# Patient Record
Sex: Female | Born: 1985 | Race: White | Hispanic: No | Smoking: Former smoker
Health system: Southern US, Community
[De-identification: ages and names within clinical notes are randomized; demographics above are authoritative.]

## PROBLEM LIST (undated history)

## (undated) DIAGNOSIS — T7840XA Allergy, unspecified, initial encounter: Secondary | ICD-10-CM

## (undated) DIAGNOSIS — R87629 Unspecified abnormal cytological findings in specimens from vagina: Secondary | ICD-10-CM

## (undated) HISTORY — DX: Allergy, unspecified, initial encounter: T78.40XA

## (undated) HISTORY — DX: Unspecified abnormal cytological findings in specimens from vagina: R87.629

## (undated) HISTORY — PX: WISDOM TOOTH EXTRACTION: SHX21

---

## 2007-02-15 LAB — CONVERTED CEMR LAB: Pap Smear: NORMAL

## 2007-02-16 ENCOUNTER — Ambulatory Visit: Payer: Self-pay | Admitting: Family Medicine

## 2007-02-16 DIAGNOSIS — J309 Allergic rhinitis, unspecified: Secondary | ICD-10-CM

## 2007-02-16 DIAGNOSIS — E669 Obesity, unspecified: Secondary | ICD-10-CM

## 2007-02-16 DIAGNOSIS — L708 Other acne: Secondary | ICD-10-CM | POA: Insufficient documentation

## 2007-02-16 DIAGNOSIS — N915 Oligomenorrhea, unspecified: Secondary | ICD-10-CM

## 2007-02-24 LAB — CONVERTED CEMR LAB
ALT: 15 units/L (ref 0–40)
Albumin: 3.9 g/dL (ref 3.5–5.2)
Alkaline Phosphatase: 71 units/L (ref 39–117)
BUN: 7 mg/dL (ref 6–23)
CO2: 29 meq/L (ref 19–32)
Calcium: 9.5 mg/dL (ref 8.4–10.5)
GFR calc Af Amer: 139 mL/min
GFR calc non Af Amer: 115 mL/min
LH: 14.2 milliintl units/mL
TSH: 0.91 microintl units/mL (ref 0.35–5.50)
Total CHOL/HDL Ratio: 3.9
Total Protein: 7.3 g/dL (ref 6.0–8.3)
VLDL: 18 mg/dL (ref 0–40)

## 2007-03-04 ENCOUNTER — Telehealth (INDEPENDENT_AMBULATORY_CARE_PROVIDER_SITE_OTHER): Payer: Self-pay | Admitting: *Deleted

## 2007-04-23 ENCOUNTER — Telehealth: Payer: Self-pay | Admitting: Family Medicine

## 2007-08-12 ENCOUNTER — Ambulatory Visit: Payer: Self-pay | Admitting: Family Medicine

## 2007-08-12 LAB — CONVERTED CEMR LAB: Rapid Strep: NEGATIVE

## 2007-08-14 ENCOUNTER — Telehealth (INDEPENDENT_AMBULATORY_CARE_PROVIDER_SITE_OTHER): Payer: Self-pay | Admitting: *Deleted

## 2007-08-14 ENCOUNTER — Encounter: Payer: Self-pay | Admitting: Family Medicine

## 2008-02-12 ENCOUNTER — Ambulatory Visit: Payer: Self-pay | Admitting: Family Medicine

## 2008-02-12 LAB — CONVERTED CEMR LAB: Rapid Strep: NEGATIVE

## 2008-07-12 ENCOUNTER — Ambulatory Visit: Payer: Self-pay | Admitting: Family Medicine

## 2008-07-20 ENCOUNTER — Other Ambulatory Visit: Admission: RE | Admit: 2008-07-20 | Discharge: 2008-07-20 | Payer: Self-pay | Admitting: Family Medicine

## 2008-07-20 ENCOUNTER — Ambulatory Visit: Payer: Self-pay | Admitting: Family Medicine

## 2008-07-20 ENCOUNTER — Encounter: Payer: Self-pay | Admitting: Family Medicine

## 2008-07-27 DIAGNOSIS — IMO0002 Reserved for concepts with insufficient information to code with codable children: Secondary | ICD-10-CM

## 2009-01-20 ENCOUNTER — Ambulatory Visit: Payer: Self-pay | Admitting: Family Medicine

## 2009-12-01 ENCOUNTER — Encounter: Payer: Self-pay | Admitting: Family Medicine

## 2009-12-01 ENCOUNTER — Ambulatory Visit: Payer: Self-pay | Admitting: Family Medicine

## 2010-02-20 ENCOUNTER — Telehealth (INDEPENDENT_AMBULATORY_CARE_PROVIDER_SITE_OTHER): Payer: Self-pay | Admitting: *Deleted

## 2010-02-20 ENCOUNTER — Encounter (INDEPENDENT_AMBULATORY_CARE_PROVIDER_SITE_OTHER): Payer: Self-pay | Admitting: *Deleted

## 2010-07-25 ENCOUNTER — Other Ambulatory Visit: Admission: RE | Admit: 2010-07-25 | Discharge: 2010-07-25 | Payer: Self-pay | Admitting: Family Medicine

## 2010-07-25 ENCOUNTER — Ambulatory Visit: Payer: Self-pay | Admitting: Family Medicine

## 2010-07-25 DIAGNOSIS — R51 Headache: Secondary | ICD-10-CM | POA: Insufficient documentation

## 2010-07-25 DIAGNOSIS — R519 Headache, unspecified: Secondary | ICD-10-CM | POA: Insufficient documentation

## 2010-07-26 ENCOUNTER — Encounter (INDEPENDENT_AMBULATORY_CARE_PROVIDER_SITE_OTHER): Payer: Self-pay | Admitting: *Deleted

## 2010-07-30 LAB — CONVERTED CEMR LAB: Pap Smear: NEGATIVE

## 2010-08-06 ENCOUNTER — Telehealth: Payer: Self-pay | Admitting: Family Medicine

## 2010-08-21 ENCOUNTER — Ambulatory Visit: Payer: Self-pay | Admitting: Family Medicine

## 2010-08-21 ENCOUNTER — Encounter: Payer: Self-pay | Admitting: Family Medicine

## 2010-08-21 LAB — CONVERTED CEMR LAB: Rapid Strep: NEGATIVE

## 2010-09-14 ENCOUNTER — Ambulatory Visit: Payer: Self-pay | Admitting: Family Medicine

## 2010-10-17 NOTE — Progress Notes (Signed)
----   Converted from flag ---- ---- 02/20/2010 8:29 AM, Kerby Nora MD wrote: CMET, lipids Dx v77.91  ---- 02/19/2010 11:54 AM, Mills Koller wrote: cpx labs for tomorrow, please ------------------------------

## 2010-10-17 NOTE — Progress Notes (Signed)
Summary: wants different birth control  Phone Note Call from Patient Call back at Home Phone [phone]   Caller: Patient Call For: Shelby Nora MD Summary of Call: Patient is asking if she can be switched to a different birth control. She says that the otho evra  patch that she is using makes her feel crazy and really emotional. [facility] [address]  Initial call taken by: Melody Comas,  August 06, 2010 1:54 PM  Follow-up for Phone Call        This is very difficult for me to answer without seeing the patient or knowing what she has tried in past.  I would recommend either making her an appointment to be seen or forwarding to Dr. Ermalene Searing (maybe she is checking emr every couple of days?) Ruthe Mannan MD  August 06, 2010 1:57 PM   Will forward to Dr. Ermalene Searing. Melody Comas  August 06, 2010 1:58 PM    Additional Follow-up for Phone Call Additional follow up Details #1::        We can have her try Depo Provera injections every 3 months or lower dose estrogen oral contraceptive pills taken daily.  If she wishes to discuss options in more detail...have her make appt to discuss.  Additional Follow-up by: Shelby Nora MD,  August 08, 2010 12:23 AM    Additional Follow-up for Phone Call Additional follow up Details #2::    Patient would like to try the lower dose estrogen pill once daily  [facility] [address] Follow-up by: Benny Lennert CMA Duncan Dull),  August 08, 2010 8:38 AM  New/Updated Medications: YAZ 3-0.02 MG TABS (DROSPIRENONE-ETHINYL ESTRADIOL) 1 tab po daily Prescriptions: YAZ 3-0.02 MG TABS (DROSPIRENONE-ETHINYL ESTRADIOL) 1 tab po daily  #1 pack x 11   Entered and Authorized by:   Shelby Nora MD   Signed by:   Shelby Nora MD on 08/10/2010   Method used:   [facility]        [id number]       [address]       [place], [place]  [address]       Ph: [phone]       Fax: [phone]   RxID:   [id number]

## 2010-10-17 NOTE — Letter (Signed)
Summary: Out of Work  [facility]  [address] [place], [place] [address]   Phone: [phone]  Fax: [phone]    August 21, 2010   Employee:  Shelby Cook    To Whom It May Concern:   For Medical reasons, please excuse the above named employee from work for the following dates:  Start:  August 21, 2010 11:57 AM   End:  August 21, 2010 11:57 AM   If you need additional information, please feel free to contact our office.         Sincerely,    Kerby Nora MD

## 2010-10-17 NOTE — Letter (Signed)
Summary: [place] No Show Letter  [place] at [facility]  [address] [place], [place] [address]   Phone: [phone]  Fax: [phone]    07/26/2010 MRN: [id number]  [facility] [address] [place], [place]  [address]   Dear Ms. SUMMERS,   Our records indicate that you missed your scheduled appointment with ____lab_________________ on ___11.10.11_________.  Please contact this office to reschedule your appointment as soon as possible.  It is important that you keep your scheduled appointments with your physician, so we can provide you the best care possible.  Please be advised that there may be a charge for "no show" appointments.    Sincerely,   [place] at [facility]

## 2010-10-17 NOTE — Assessment & Plan Note (Signed)
Summary: CONGESTION,FEVER/CLE   Vital Signs:  Patient profile:   25 year old female Height:      66.5 inches Weight:      232.38 pounds BMI:     37.08 Temp:     99.9 degrees F oral Pulse rate:   80 / minute Pulse rhythm:   regular BP sitting:   114 / 74  (left arm) Cuff size:   large  Vitals Entered By: Delilah Shan CMA Duncan Dull) (December 01, 2009 2:43 PM) CC: Congestion, fever   History of Present Illness: 25 yo healthy feamle with 3 day h/o of fevers, congestion cough. Fever T max 101. Has body aches. Cough is not productive but she feels like she has mucous that she can't get up. Hurts to cough, right lower chest. No SOB, no audible wheezing. Never had anything like this before. Had flu shot this year.  Current Medications (verified): 1)  Claritin 10 Mg Tabs (Loratadine) .... Take 1 Tablet By Mouth Once A Day 2)  Azithromycin 250 Mg  Tabs (Azithromycin) .... 2 By  Mouth Today and Then 1 Daily For 4 Days 3)  Cheratussin Ac 100-10 Mg/63ml Syrp (Guaifenesin-Codeine) .... 5 Ml At Bedtime As Needed Cough  Allergies (verified): No Known Drug Allergies  Review of Systems      See HPI General:  Complains of chills and fever; denies fatigue. Resp:  Complains of coughing up blood; denies shortness of breath, sputum productive, and wheezing.  Physical Exam  General:  alert, well-developed, well-nourished, and well-hydrated.   No Increased WOB Nose:  boggy and edematus, sinuses tender throughout no airflow obstruction.   Mouth:  no exudates and pharyngeal erythema.   Lungs:  moist cough, poor respiratory effort when taking deep breaths-pleuritic pain. No crackles but not good exam due to poor effort. Heart:  Normal rate and regular rhythm. S1 and S2 normal without gallop, murmur, click, rub or other extra sounds. Extremities:  no edema Psych:  normally interactive and good eye contact.     Impression & Recommendations:  Problem # 1:  COUGH (ICD-786.2) Assessment  New given fever and pleuritic pain, will cover with Zpack. Send for 2 view CXR to rule out pneumonia. Cheratussin for cough.   Orders: Radiology Referral (Radiology)  Complete Medication List: 1)  Claritin 10 Mg Tabs (Loratadine) .... Take 1 tablet by mouth once a day 2)  Azithromycin 250 Mg Tabs (Azithromycin) .... 2 by  mouth today and then 1 daily for 4 days 3)  Cheratussin Ac 100-10 Mg/51ml Syrp (Guaifenesin-codeine) .... 5 ml at bedtime as needed cough  Patient Instructions: 1)  Take antibiotic as directed.  Drink lots of fluids.  Treat sympotmatically with Mucinex, nasal saline irrigation, and Tylenol/Ibuprofen..  Cough suppressant at night. Call if not improving as expected in 5-7 days.  2)  Stop by to see Shirlee Limerick on the way out to set up your chest xray. Prescriptions: CHERATUSSIN AC 100-10 MG/5ML SYRP (GUAIFENESIN-CODEINE) 5 ml at bedtime as needed cough  #4 ounces x 0   Entered and Authorized by:   Ruthe Mannan MD   Signed by:   Ruthe Mannan MD on 12/01/2009   Method used:   Print then Give to Patient   RxID:   [id number] AZITHROMYCIN 250 MG  TABS (AZITHROMYCIN) 2 by  mouth today and then 1 daily for 4 days  #6 x 0   Entered and Authorized by:   Ruthe Mannan MD   Signed by:   Ruthe Mannan MD on  12/01/2009   Method used:   [facility]        [id number]       [address]       [place], [place]  [address]       Ph: [phone]       Fax: [phone]   RxID:   [id number]   Current Allergies (reviewed today): No known allergies

## 2010-10-17 NOTE — Letter (Signed)
Summary: [place] No Show Letter  [place] at [facility]  [address] [place], [place] [address]   Phone: [phone]  Fax: [phone]    02/20/2010 MRN: [id number]  [facility] [address] [place], [place]  [address]   Dear Ms. SUMMERS,   Our records indicate that you missed your scheduled appointment with ___Lab__________________ on __6.7.11__________.  Please contact this office to reschedule your appointment as soon as possible.  It is important that you keep your scheduled appointments with your physician, so we can provide you the best care possible.  Please be advised that there may be a charge for "no show" appointments.    Sincerely,   [place] at [facility]

## 2010-10-17 NOTE — Assessment & Plan Note (Signed)
Summary: CPX/CLE   Vital Signs:  Patient profile:   25 year old female Height:      66.5 inches Weight:      234.0 pounds BMI:     37.34 Temp:     99.2 degrees F oral Pulse rate:   80 / minute Pulse rhythm:   regular BP sitting:   110 / 60  (left arm) Cuff size:   large  Vitals Entered By: Benny Lennert CMA Duncan Dull) (July 25, 2010 2:27 PM)  History of Present Illness: Chief complaint cpx  The patient is here for annual wellness exam and preventative care.       In past.. 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 Sent to GYN for colposcopy.Pt already notified per report.  low grade dysplasia.  Recommended q 3 month pap smears..she was unable to do so given dropped of step dad's insurance.    In last 3 months....headaches every other day. Pain is in posterior head..feels like pressure. No throbbing. No nausea,  Mild sensitivity to light.  No vomiting. No numbness, no weakness. No neck pain. No fever.   Tylenol and excedrine do not helps. Some increase in stress in last few months.  No family hisotry of migraines.  Has gained weight in last 2 year... 50 lbs. Exercsiing 2 time a week. Healthy eating...fruits and veggies.   Problems Prior to Update: 1)  Headache  (ICD-784.0) 2)  Abnormal Pap Smear, Lgsil  (ICD-795.09) 3)  Oligomenorrhea  (ICD-626.1) 4)  Acne Nec  (ICD-706.1) 5)  Obesity Nos  (ICD-278.00) 6)  Healthy Young Woman  (ICD-V70.0) 7)  Allergic Rhinitis  (ICD-477.9)  Current Medications (verified): 1)  Allegra Allergy 180 Mg Tabs (Fexofenadine Hcl) .... Take One Tablet Daily 2)  Multivitamins  Caps (Multiple Vitamin) .... One Tablet Daily  Allergies (verified): No Known Drug Allergies  Past History:  Past medical, surgical, family and social histories (including risk factors) reviewed, and no changes noted (except as noted below).  Past Medical History: Reviewed history from 02/16/2007 and no changes required. Allergic rhinitis  Past Surgical History: Reviewed  history from 02/16/2007 and no changes required. Denies surgical history  Family History: Reviewed history from 02/16/2007 and no changes required. father age 3 bipolar do, drug abuse mother age 95 osteoporosis 2 sisters: allergies, scoliosis PGF: MI less tan age 75, DM PGM: breast cancer and lung cancer  Social History: Reviewed history from 02/16/2007 and no changes required. Never Smoked Alcohol use-yes rarely Drug use-no Occupation: [employer], was at [employer] now looking at [employer],  6 month relationship, no sex  Regular exercise-yes, joining [employer] Diet: Avoiding sugar, fruits and veggies  Review of Systems       Light fine hair on sides of face, mustache... General:  Denies fatigue and fever. CV:  Denies chest pain or discomfort. Resp:  Denies shortness of breath. GI:  Denies abdominal pain, bloody stools, constipation, and diarrhea. GU:  Complains of abnormal vaginal bleeding; denies dysuria; Oligomennorhea.Marland Kitchenoccuring only once every three months...in last 2 years. current sexual activity.  Physical Exam  General:  obese appearing female iNNAD  Head:  no maxillary sinus ttp Eyes:  No corneal or conjunctival inflammation noted. EOMI. Perrla. Funduscopic exam benign, without hemorrhages, exudates or papilledema. Vision grossly normal. Ears:  External ear exam shows no significant lesions or deformities.  Otoscopic examination reveals clear canals, tympanic membranes are intact bilaterally without bulging, retraction, inflammation or discharge. Hearing is grossly normal bilaterally. Nose:  External nasal examination shows no  deformity or inflammation. Nasal mucosa are pink and moist without lesions or exudates. Mouth:  Oral mucosa and oropharynx without lesions or exudates.  Teeth in good repair. Neck:  no carotid bruit or thyromegaly no cervical or supraclavicular lymphadenopathy  Chest Wall:  No deformities, masses, or tenderness noted. Breasts:  No mass,  nodules, thickening, tenderness, bulging, retraction, inflamation, nipple discharge or skin changes noted.   Lungs:  Normal respiratory effort, chest expands symmetrically. Lungs are clear to auscultation, no crackles or wheezes. Heart:  Normal rate and regular rhythm. S1 and S2 normal without gallop, murmur, click, rub or other extra sounds. Abdomen:  Bowel sounds positive,abdomen soft and non-tender without masses, organomegaly or hernias noted. Genitalia:  Pelvic Exam:        External: normal female genitalia without lesions or masses        Vagina: normal without lesions or masses        Cervix: normal without lesions or masses        Adnexa: normal bimanual exam without masses or fullness        Uterus: normal by palpation        Pap smear: performed Pulses:  R and L posterior tibial pulses are full and equal bilaterally  Extremities:  no edmea Skin:  Intact without suspicious lesions or rashes Psych:  Cognition and judgment appear intact. Alert and cooperative with normal attention span and concentration. No apparent delusions, illusions, hallucinations   Impression & Recommendations:  Problem # 1:  HEALTHY YOUNG WOMAN (ICD-V70.0) The patient's preventative maintenance and recommended screening tests for an annual wellness exam were reviewed in full today. Brought up to date unless services declined.  Counselled on the importance of diet, exercise, and its role in overall health and mortality. The patient's FH and SH was reviewed, including their home life, tobacco status, and drug and alcohol status.     Problem # 2:  ABNORMAL PAP SMEAR, LGSIL (ICD-795.09) PAp pending today.   Problem # 3:  HEADACHE (ICD-784.0) Keep headache diary. Reviewed migraine triggers.  Problem # 4:  OLIGOMENORRHEA (ICD-626.1) Most consistent with PCOS.Marland Kitchenwill reeval  LH and FSH. Start back on hormonal birth control.  Complete Medication List: 1)  Allegra Allergy 180 Mg Tabs (Fexofenadine hcl) ....  Take one tablet daily 2)  Multivitamins Caps (Multiple vitamin) .... One tablet daily 3)  Ortho Evra 150-20 Mcg/24hr Ptwk (Norelgestromin-eth estradiol) .... Apply as directed  Other Orders: Admin 1st Vaccine (04540) Flu Vaccine 71yrs + (98119) Tdap => 33yrs IM (14782) Admin of Any Addtl Vaccine (95621)  Patient Instructions: 1)  Fasting lipid,CMET,FSH, LH, TSH  Dx v77.91, 626.1 2)   Likely PCOS...will call with lab results 3)   Go ahead and start ortho Evra patch. 4)   Start headache diary. 5)   Review headahce info packet. 6)   Follow up in 1 month for headache. Prescriptions: ORTHO EVRA 150-20 MCG/24HR PTWK (NORELGESTROMIN-ETH ESTRADIOL) Apply as directed  #4 x 11   Entered and Authorized by:   Kerby Nora MD   Signed by:   Kerby Nora MD on 07/25/2010   Method used:   [facility]        [facility] [phone][facility] (retail)       [address] [place], [place]  [address]       Ph: [phone] or [phone]       Fax: [phone]  RxID:   [id number]    Orders Added: 1)  Est. Patient 18-39 years [99395] 2)  Admin 1st Vaccine [90471] 3)  Flu Vaccine 25yrs + [14782] 4)  Tdap => 53yrs IM [90715] 5)  Admin of Any Addtl Vaccine [95621]   Immunizations Administered:  Tetanus Vaccine:    Vaccine Type: Tdap    Site: right deltoid    Mfr: GlaxoSmithKline    Dose: 0.5 ml    Route: IM    Given by: Benny Lennert CMA (AAMA)    Exp. Date: 07/05/2012    Lot #: [id number]    VIS given: 08/03/08 version given July 25, 2010.   Immunizations Administered:  Tetanus Vaccine:    Vaccine Type: Tdap    Site: right deltoid    Mfr: GlaxoSmithKline    Dose: 0.5 ml    Route: IM    Given by: Benny Lennert CMA (AAMA)    Exp. Date: 07/05/2012    Lot #: [id number]    VIS given: 08/03/08 version given July 25, 2010.  Current Allergies (reviewed today): No known allergies   Last Flu Vaccine:  given (06/16/2008 3:14:54 PM) Flu Vaccine  Result Date:  07/25/2010 Flu Vaccine Result:  given Flu Vaccine Next Due:  1 yr Last TD:  given (09/17/1999 3:15:54 PM) TD Result Date:  07/25/2010 TD Result:  given TD Next Due:  10 yr                   Flu Vaccine Consent Questions     Do you have a history of severe allergic reactions to this vaccine? no    Any prior history of allergic reactions to egg and/or gelatin? no    Do you have a sensitivity to the preservative Thimersol? no    Do you have a past history of Guillan-Barre Syndrome? no    Do you currently have an acute febrile illness? no    Have you ever had a severe reaction to latex? no    Vaccine information given and explained to patient? yes    Are you currently pregnant? no    Lot Number:[id number]   Exp Date:03/16/2011   Site Given  Left Deltoid IM

## 2010-10-17 NOTE — Assessment & Plan Note (Signed)
Summary: 11:30 ST/CLE   Vital Signs:  Patient profile:   25 year old female Height:      66.5 inches Weight:      235.8 pounds BMI:     37.62 Temp:     98.4 degrees F oral Pulse rate:   80 / minute Pulse rhythm:   regular BP sitting:   110 / 70  (left arm) Cuff size:   large  Vitals Entered By: Benny Lennert CMA Duncan Dull) (August 21, 2010 11:31 AM)  History of Present Illness: Chief complaint Sore swollen throat    Acute Visit History:      The patient complains of nasal discharge and sore throat.  These symptoms began 2 days ago.  She denies chest pain, cough, earache, fever, and sinus problems.  Other comments include: thoat swollen in front.  Improved some now.   USing dayquil for sympotms.        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  Physical Exam  General:  Well-developed,well-nourished,in no acute distress; alert,appropriate and cooperative throughout examination Ears:  External ear exam shows no significant lesions or deformities.  Otoscopic examination reveals clear canals, tympanic membranes are intact bilaterally without bulging, retraction, inflammation or discharge. Hearing is grossly normal bilaterally. Nose:  External nasal examination shows no deformity or inflammation. Nasal mucosa are pink and moist without lesions or exudates. Mouth:  good dentition, no exudates, no posterior lymphoid hypertrophy, and pharyngeal erythema.   Neck:  mild anterior cervical ttp, no nodes palpated Lungs:  Normal respiratory effort,  chest expands symmetrically. Lungs are clear to auscultation, no crackles or wheezes. Heart:  Normal rate and regular rhythm. S1 and S2 normal without gallop, murmur, click, rub or other extra sounds.   Impression & Recommendations:  Problem # 1:  URI (ICD-465.9)  Her updated medication list for this problem includes:    Allegra Allergy 180 Mg Tabs (Fexofenadine hcl) .Marland Kitchen... Take one tablet daily  Instructed on symptomatic treatment. Call if symptoms persist or worsen.   Complete Medication List: 1)  Allegra Allergy 180 Mg Tabs (Fexofenadine hcl) .... Take one tablet daily 2)  Multivitamins Caps (Multiple vitamin) .... One tablet daily 3)  Yaz 3-0.02 Mg Tabs (Drospirenone-ethinyl estradiol) .Marland Kitchen.. 1 tab po daily  Patient Instructions: 1)  Dayquil for symptoms. 2)  Ibuprofen for ST pain. 3)  Call if not gradaully improving  after 5 days.    Orders Added: 1)  Est. Patient Level II [40981]    Current Allergies (reviewed today): No known allergies   Laboratory Results    Other Tests  Rapid Strep: negative  Kit Test Internal QC: Negative   (Normal Range: Negative)

## 2010-11-16 ENCOUNTER — Encounter: Payer: Self-pay | Admitting: Family Medicine

## 2010-11-16 ENCOUNTER — Encounter (INDEPENDENT_AMBULATORY_CARE_PROVIDER_SITE_OTHER): Payer: Self-pay | Admitting: *Deleted

## 2010-11-16 ENCOUNTER — Ambulatory Visit (INDEPENDENT_AMBULATORY_CARE_PROVIDER_SITE_OTHER): Payer: 59 | Admitting: Family Medicine

## 2010-11-16 DIAGNOSIS — B309 Viral conjunctivitis, unspecified: Secondary | ICD-10-CM

## 2010-11-16 DIAGNOSIS — J209 Acute bronchitis, unspecified: Secondary | ICD-10-CM

## 2010-11-22 NOTE — Assessment & Plan Note (Signed)
Summary: ?SINUS INFECTION,?PINK EYE/CLE   [facility]   Vital Signs:  Patient profile:   25 year old female Weight:      233 pounds Temp:     99.3 degrees F oral Pulse rate:   68 / minute Pulse rhythm:   regular BP sitting:   108 / 70  (left arm) Cuff size:   large  Vitals Entered By: Selena Batten Dance CMA Duncan Dull) (November 16, 2010 2:36 PM) CC: ? Sinus infection/? pink eye   History of Present Illness: CC: sinus? pink eye  1 mo h/o nasal congestion, cough productive of green sputum, sinus headache helped by tylenol.  No pressure in face.  Tried advil cold/sinus and allergy medicine (allegra).  + nasal discharge, clear.  Cough worse at night.  Today awoke with L eye matted shut, red.  yellow.  no vision changes.  Washed with soap.  Itchy.  No pain.  No ST, abd pain, fevers/chills, n/v/d, rashes, myalgias, arthralgias.  No sick contacts, no smokers at home, no h/o asthma.  + allergies.  Current Medications (verified): 1)  Allegra Allergy 180 Mg Tabs (Fexofenadine Hcl) .... Take One Tablet Daily 2)  Multivitamins  Caps (Multiple Vitamin) .... One Tablet Daily 3)  Yaz 3-0.02 Mg Tabs (Drospirenone-Ethinyl Estradiol) .Marland Kitchen.. 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  PERRLA, EOMI wihtout pain.  No matting appreciated today Ears:  TMs  clear bilaterally Nose:  nares congested bilaterally Mouth:  MMM, + pharyngeal erythema no exudates Neck:  no LAD Lungs:  Normal respiratory effort, chest expands symmetrically. Lungs are clear to auscultation, no crackles or wheezes. Heart:  Normal rate and regular rhythm. S1 and S2 normal without gallop, murmur, click, rub or other extra sounds. Pulses:  2+ rad pulses, brisk cap refill   Impression & Recommendations:  Problem # 1:  ACUTE BRONCHITIS (ICD-466.0) going on 1 mo.  treat with zpack.  red flags to return discussed.  Her updated medication list for this problem includes:    Zithromax Z-pak 250 Mg Tabs (Azithromycin) ..... Use as directed  Problem # 2:  CONJUNCTIVITIS, VIRAL, LEFT (ICD-077.99) supportive care as per instructions.  wash hands as contagious.  Complete Medication List: 1)  Allegra Allergy 180 Mg Tabs (Fexofenadine hcl) .... Take one tablet daily 2)  Multivitamins Caps (Multiple vitamin) .... One tablet daily 3)  Yaz 3-0.02 Mg Tabs (Drospirenone-ethinyl estradiol) .Marland Kitchen.. 1 tab po daily 4)  Zithromax Z-pak 250 Mg Tabs (Azithromycin) .... Use as directed  Patient Instructions: 1)  Sounds like bronchitis, treat with zpack antibiotic as going on for 1 month. 2)  For eye i think it's pink eye (viral conjunctivitis).  Treat with warm compresses, and lubricating eye drops (OTC). 3)  delsym cough syrup. 4)  Push fluids and plenty of rest. 5)  Nasal saline irrigation. 6)  Call us if fevers >101.5, cough worsening, or other concerns. Prescriptions: ZITHROMAX Z-PAK 250  MG TABS (AZITHROMYCIN) use as directed  #1 x 0   Entered and Authorized by:   Eustaquio Boyden  MD   Signed by:   Eustaquio Boyden  MD on 11/16/2010   Method used:   [facility]        [facility] [phone][facility] (retail)       [address] [place], [place]  [address]       Ph: [phone] or [phone]       Fax: [phone]   RxID:   [id number]    Orders  Added: 1)  Est. Patient Level III [36644]    Current Allergies (reviewed today): No known allergies

## 2010-11-22 NOTE — Letter (Signed)
Summary: Out of Work  [facility]  [address] [place], [place] [address]   Phone: [phone]  Fax: [phone]    November 16, 2010   Employee:  TARINI CARRIER    To Whom It May Concern:   For Medical reasons, please excuse the above named employee from work for the following dates:  Start:  November 16, 2010   End:  November 16, 2010   If you need additional information, please feel free to contact our office.         Sincerely,       Selena Batten Dance CMA (AAMA)

## 2011-07-17 ENCOUNTER — Encounter: Payer: Self-pay | Admitting: *Deleted

## 2011-07-17 ENCOUNTER — Encounter: Payer: Self-pay | Admitting: Family Medicine

## 2011-07-17 ENCOUNTER — Ambulatory Visit (INDEPENDENT_AMBULATORY_CARE_PROVIDER_SITE_OTHER): Payer: 59 | Admitting: Family Medicine

## 2011-07-17 VITALS — BP 110/80 | HR 93 | Temp 98.0°F | Ht 67.0 in | Wt 225.0 lb

## 2011-07-17 DIAGNOSIS — J069 Acute upper respiratory infection, unspecified: Secondary | ICD-10-CM

## 2011-07-17 DIAGNOSIS — R110 Nausea: Secondary | ICD-10-CM

## 2011-07-17 MED ORDER — PROMETHAZINE HCL 25 MG PO TABS: 25.0000 mg | ORAL_TABLET | Freq: Four times a day (QID) | ORAL | Status: AC | PRN

## 2011-07-17 MED ORDER — HYDROCODONE-HOMATROPINE 5-1.5 MG/5ML PO SYRP: ORAL_SOLUTION | ORAL | Status: AC

## 2011-07-17 NOTE — Progress Notes (Signed)
  Subjective:    Patient ID: Shelby Cook, female    DOB: 08/24/1986, 25 y.o.   MRN: [id number]  HPI  Shelby Cook, a 25 y.o. female presents today in the office for the following:    Got really nauseous. Dry-heaving. Still with a runny nose and chest  Had a fever the first day Niece is sick.  Now is the head congestion and nausea.   Patent presents with runny nose, sneezing, cough, sore throat, malaise and minimal / low-grade fever .   + recent exposure to others with similar symptoms.   The patent denies sore throat as the primary complaint. Denies sthortness of breath/wheezing, high fever, chest pain, rhinits for more than 14 days, significant myalgia, otalgia, facial pain, abdominal pain, changes in bowel or bladder.  PMH, PHS, Allergies, Problem List, Medications, Family History, and Social History have all been reviewed.  ROS: as above, eating and drinking - tolerating PO. + nausea Urinating normally. No excessive vomitting or diarrhea. O/w as above.  PHYSICAL EXAM  Blood pressure 110/80, pulse 93, temperature 98 F (36.7 C), temperature source Oral, height 5\' 7"  (1.702 m), weight 225 lb (102.059 kg), SpO2 99.00%.  PE: GEN: WDWN, Non-toxic, Atraumatic, normocephalic. A and O x 3. HEENT: Oropharynx clear without exudate, MMM, no significant LAD, mild rhinnorhea Ears: TM clear, COL visualized with good landmarks CV: RRR, no m/g/r. Pulm: CTA B, no wheezes, rhonchi, or crackles, normal respiratory effort. EXT: no c/c/e Psych: well oriented, neither depressed nor anxious in appearance  A/P: 1. URI. Supportive care reviewed with patient. See patient instruction section. 2. Nausea: phenergan prn, advance diet as tol   The PMH, PSH, Social History, Family History, Medications, and allergies have been reviewed in [facility], and have been updated if relevant.   Review of Systems     Objective:   Physical Exam        Assessment & Plan:

## 2011-08-15 ENCOUNTER — Other Ambulatory Visit: Payer: Self-pay | Admitting: Family Medicine

## 2012-04-09 ENCOUNTER — Other Ambulatory Visit (HOSPITAL_COMMUNITY)
Admission: RE | Admit: 2012-04-09 | Discharge: 2012-04-09 | Disposition: A | Discharge status: Home / Self Care | Payer: BC Managed Care – PPO | Source: Ambulatory Visit | Attending: Family Medicine | Admitting: Family Medicine

## 2012-04-09 ENCOUNTER — Other Ambulatory Visit: Payer: Self-pay | Admitting: Family Medicine

## 2012-04-09 DIAGNOSIS — Z124 Encounter for screening for malignant neoplasm of cervix: Secondary | ICD-10-CM | POA: Insufficient documentation

## 2013-11-08 ENCOUNTER — Emergency Department (HOSPITAL_BASED_OUTPATIENT_CLINIC_OR_DEPARTMENT_OTHER)
Admission: EM | Admit: 2013-11-08 | Discharge: 2013-11-08 | Disposition: A | Discharge status: Home / Self Care | Payer: BC Managed Care – PPO | Attending: Emergency Medicine | Admitting: Emergency Medicine

## 2013-11-08 ENCOUNTER — Other Ambulatory Visit: Payer: Self-pay

## 2013-11-08 ENCOUNTER — Encounter (HOSPITAL_BASED_OUTPATIENT_CLINIC_OR_DEPARTMENT_OTHER): Payer: Self-pay | Admitting: Emergency Medicine

## 2013-11-08 DIAGNOSIS — F410 Panic disorder [episodic paroxysmal anxiety] without agoraphobia: Secondary | ICD-10-CM | POA: Insufficient documentation

## 2013-11-08 DIAGNOSIS — Z3202 Encounter for pregnancy test, result negative: Secondary | ICD-10-CM | POA: Insufficient documentation

## 2013-11-08 DIAGNOSIS — F17200 Nicotine dependence, unspecified, uncomplicated: Secondary | ICD-10-CM | POA: Insufficient documentation

## 2013-11-08 DIAGNOSIS — Z79899 Other long term (current) drug therapy: Secondary | ICD-10-CM | POA: Insufficient documentation

## 2013-11-08 LAB — CBG MONITORING, ED: GLUCOSE-CAPILLARY: 113 mg/dL — AB (ref 70–99)

## 2013-11-08 LAB — PREGNANCY, URINE: PREG TEST UR: NEGATIVE

## 2013-11-08 NOTE — ED Notes (Signed)
CBG: 113 °

## 2013-11-08 NOTE — ED Provider Notes (Signed)
TIME SEEN: 1:22 PM  CHIEF COMPLAINT: dizziness, heart racing, hyperventilating, shaking  HPI: Pt is a 28 y.o. F with no significant past medical history who presents to the emergency department with an episode of palpitations, feeling lightheaded, hyperventilating and shaking. She states this happened when going to eat with her friend. She denies that she was feeling anxious about anything at that time. She's never had similar symptoms in the past. This lasted for several minutes and then resolved. She reports she feels much better now and is back to baseline. He also reports that earlier today while walking down the stairs she felt her legs gave out from her and she slipped but did not fall completely her head her head. She denies any chest pain or shortness of breath. No recent vomiting or diarrhea. No numbness, tingling or focal weakness.  ROS: See HPI Constitutional: no fever  Eyes: no drainage  ENT: no runny nose   Cardiovascular:  no chest pain  Resp: no SOB  GI: no vomiting GU: no dysuria Integumentary: no rash  Allergy: no hives  Musculoskeletal: no leg swelling  Neurological: no slurred speech ROS otherwise negative  PAST MEDICAL HISTORY/PAST SURGICAL HISTORY:  Past Medical History  Diagnosis Date  . Allergy     MEDICATIONS:  Prior to Admission medications   Medication Sig Start Date End Date Taking? Authorizing Provider  fexofenadine (ALLEGRA) 180 MG tablet Take 180 mg by mouth daily.      Historical Provider, MD  GIANVI 3-0.02 MG tablet TAKE 1 TABLET BY MOUTH EVERY DAY 08/15/11   Amy Michelle NasutiE Bedsole, MD  Multiple Vitamin (MULTIVITAMIN) tablet Take 1 tablet by mouth daily.      Historical Provider, MD    ALLERGIES:  No Known Allergies  SOCIAL HISTORY:  History  Substance Use Topics  . Smoking status: Current Every Day Smoker -- 0.50 packs/day    Types: Cigarettes  . Smokeless tobacco: Not on file  . Alcohol Use: Yes    FAMILY HISTORY: Family History  Problem  Relation Age of Onset  . Osteoporosis Mother   . Drug abuse Father   . Mental illness Father     bipolar  . Scoliosis Sister   . Cancer Paternal Grandmother     breast and lung  . Diabetes Paternal Grandfather   . Heart attack Paternal Grandfather   . Allergies Sister     EXAM: BP 145/76  Pulse 94  Temp(Src) 98.3 F (36.8 C) (Oral)  Resp 16  Ht 5\' 7"  (1.702 m)  Wt 250 lb (113.399 kg)  BMI 39.15 kg/m2  SpO2 100% CONSTITUTIONAL: Alert and oriented and responds appropriately to questions. Well-appearing; well-nourished HEAD: Normocephalic EYES: Conjunctivae clear, PERRLcomment no conjunctival pallor ENT: normal nose; no rhinorrhea; moist mucous membranes; pharynx without lesions noted NECK: Supple, no meningismus, no LAD  CARD: RRR; S1 and S2 appreciated; no murmurs, no clicks, no rubs, no gallops RESP: Normal chest excursion without splinting or tachypnea; breath sounds clear and equal bilaterally; no wheezes, no rhonchi, no rales,  ABD/GI: Normal bowel sounds; non-distended; soft, non-tender, no rebound, no guarding BACK:  The back appears normal and is non-tender to palpation, there is no CVA tenderness; no midline spinal tenderness or step-off or deformity EXT: Normal ROM in all joints; non-tender to palpation; no edema; normal capillary refill; no cyanosis    SKIN: Normal color for age and race; warm NEURO: Moves all extremities equally, cranial nerves II through XII intact, sensation to light touch intact diffusely PSYCH:  The patient's mood and manner are appropriate. Grooming and personal hygiene are appropriate.  MEDICAL DECISION MAKING: patient here with an episode of palpitations, hyperventilation, shaking and dizziness. Suspect likely panic attack. She is back to her baseline and is hemodynamically stable. Her EKG shows no ischemic changes, LVH, predominant, delta wave. She is in normal sinus rhythm. Check CBG and urine pregnancy test. Given she is otherwise healthy,  well-appearing young female with no other risk factors for ACS or pulmonary embolus, I do not feel she needs further workup at this time.  ED PROGRESS: Pt's blood glucose was 113. Her urine pregnancy test is negative. We'll discharge him with return precautions. Patient verbalizes understanding and is comfortable plan.     Date: 11/08/2013 12:38  Rate: 98  Rhythm: normal sinus rhythm  QRS Axis: normal  Intervals: normal  ST/T Wave abnormalities: normal  Conduction Disutrbances: none  Narrative Interpretation: unremarkable; no ischemic changes, no LVH, no Brugada, no delta wave, no arrhythmia       Layla Maw Ward, DO 11/08/13 1413

## 2013-11-08 NOTE — ED Notes (Signed)
Pt reports that her legs gave out at work-denies hitting head, denies falling.  Reports going to eat, feeling dizzy and then feeling like her heart was racing-pt noted to be shaking, hyperventilating.

## 2013-11-08 NOTE — ED Notes (Signed)
Pt took Plan B on Saturday.

## 2013-11-08 NOTE — Discharge Instructions (Signed)
Panic Attacks °Panic attacks are sudden, short-lived surges of severe anxiety, fear, or discomfort. They may occur for no reason when you are relaxed, when you are anxious, or when you are sleeping. Panic attacks may occur for a number of reasons:  °· Healthy people occasionally have panic attacks in extreme, life-threatening situations, such as war or natural disasters. Normal anxiety is a protective mechanism of the body that helps us react to danger (fight or flight response). °· Panic attacks are often seen with anxiety disorders, such as panic disorder, social anxiety disorder, generalized anxiety disorder, and phobias. Anxiety disorders cause excessive or uncontrollable anxiety. They may interfere with your relationships or other life activities. °· Panic attacks are sometimes seen with other mental illnesses such as depression and posttraumatic stress disorder. °· Certain medical conditions, prescription medicines, and drugs of abuse can cause panic attacks. °SYMPTOMS  °Panic attacks start suddenly, peak within 20 minutes, and are accompanied by four or more of the following symptoms: °· Pounding heart or fast heart rate (palpitations). °· Sweating. °· Trembling or shaking. °· Shortness of breath or feeling smothered. °· Feeling choked. °· Chest pain or discomfort. °· Nausea or strange feeling in your stomach. °· Dizziness, lightheadedness, or feeling like you will faint. °· Chills or hot flushes. °· Numbness or tingling in your lips or hands and feet. °· Feeling that things are not real or feeling that you are not yourself. °· Fear of losing control or going crazy. °· Fear of dying. °Some of these symptoms can mimic serious medical conditions. For example, you may think you are having a heart attack. Although panic attacks can be very scary, they are not life threatening. °DIAGNOSIS  °Panic attacks are diagnosed through an assessment by your health care provider. Your health care provider will ask questions  about your symptoms, such as where and when they occurred. Your health care provider will also ask about your medical history and use of alcohol and drugs, including prescription medicines. Your health care provider may order blood tests or other studies to rule out a serious medical condition. Your health care provider may refer you to a mental health professional for further evaluation. °TREATMENT  °· Most healthy people who have one or two panic attacks in an extreme, life-threatening situation will not require treatment. °· The treatment for panic attacks associated with anxiety disorders or other mental illness typically involves counseling with a mental health professional, medicine, or a combination of both. Your health care provider will help determine what treatment is best for you. °· Panic attacks due to physical illness usually goes away with treatment of the illness. If prescription medicine is causing panic attacks, talk with your health care provider about stopping the medicine, decreasing the dose, or substituting another medicine. °· Panic attacks due to alcohol or drug abuse goes away with abstinence. Some adults need professional help in order to stop drinking or using drugs. °HOME CARE INSTRUCTIONS  °· Take all your medicines as prescribed.   °· Check with your health care provider before starting new prescription or over-the-counter medicines. °· Keep all follow up appointments with your health care provider. °SEEK MEDICAL CARE IF: °· You are not able to take your medicines as prescribed. °· Your symptoms do not improve or get worse. °SEEK IMMEDIATE MEDICAL CARE IF:  °· You experience panic attack symptoms that are different than your usual symptoms. °· You have serious thoughts about hurting yourself or others. °· You are taking medicine for panic attacks and   have a serious side effect. MAKE SURE YOU:  Understand these instructions.  Will watch your condition.  Will get help right away  if you are not doing well or get worse. Document Released: 09/02/2005 Document Revised: 06/23/2013 Document Reviewed: 04/16/2013 [facility] Patient Information 2014 [place], [place].   Your EKG, blood glucose were normal. Your pregnancy test was negative. Please followup with your primary care physician as needed.

## 2015-01-19 ENCOUNTER — Emergency Department (INDEPENDENT_AMBULATORY_CARE_PROVIDER_SITE_OTHER): Payer: 59

## 2015-01-19 ENCOUNTER — Encounter: Payer: Self-pay | Admitting: *Deleted

## 2015-01-19 ENCOUNTER — Emergency Department
Admission: EM | Admit: 2015-01-19 | Discharge: 2015-01-19 | Disposition: A | Discharge status: Home / Self Care | Payer: 59 | Source: Home / Self Care | Attending: Emergency Medicine | Admitting: Emergency Medicine

## 2015-01-19 DIAGNOSIS — S20211A Contusion of right front wall of thorax, initial encounter: Secondary | ICD-10-CM

## 2015-01-19 DIAGNOSIS — R079 Chest pain, unspecified: Secondary | ICD-10-CM

## 2015-01-19 MED ORDER — IBUPROFEN 200 MG PO TABS: ORAL_TABLET | ORAL | Status: DC

## 2015-01-19 MED ORDER — HYDROCODONE-ACETAMINOPHEN 5-325 MG PO TABS
1.0000 | ORAL_TABLET | ORAL | Status: DC | PRN
Start: 2015-01-19 — End: 2015-08-04

## 2015-01-19 NOTE — Discharge Instructions (Signed)
°  Rib Contusion A rib contusion (bruise) can occur by a blow to the chest or by a fall against a hard object. Usually these will be much better in a couple weeks. If X-rays were taken today and there are no broken bones (fractures), the diagnosis of bruising is made. However, broken ribs may not show up for several days, or may be discovered later on a routine X-ray when signs of healing show up. If this happens to you, it does not mean that something was missed on the X-ray, but simply that it did not show up on the first X-rays. Earlier diagnosis will not usually change the treatment. HOME CARE INSTRUCTIONS   Avoid strenuous activity. Be careful during activities and avoid bumping the injured ribs. Activities that pull on the injured ribs and cause pain should be avoided, if possible.--Excused from work until Monday 5/9  For the first day or two, an ice pack used every 20 minutes while awake may be helpful. Put ice in a plastic bag and put a towel between the bag and the skin.  Eat a normal, well-balanced diet. Drink plenty of fluids to avoid constipation.  Take deep breaths several times a day to keep lungs free of infection. Try to cough several times a day. Splint the injured area with a pillow while coughing to ease pain. Coughing can help prevent pneumonia.  Wear a rib belt or binder only if told to do so by your caregiver. If you are wearing a rib belt or binder, you must do the breathing exercises as directed by your caregiver. If not used properly, rib belts or binders restrict breathing which can lead to pneumonia.  Only take over-the-counter or prescription medicines for pain, discomfort, or fever as directed by your caregiver. SEEK MEDICAL CARE IF:   You have an oral temperature above 102 F (38.9 C).  You develop a cough, with thick or bloody sputum. SEEK IMMEDIATE MEDICAL CARE IF:   You have difficulty breathing.  You feel sick to your stomach (nausea), have vomiting or belly  (abdominal) pain.  You have worsening pain, not controlled with medications, or there is a change in the location of the pain.  You develop sweating or radiation of the pain into the arms, jaw or shoulders, or become light headed or faint.  You or your child has an oral temperature above 102 F (38.9 C), not controlled by medicine. MAKE SURE YOU:   Understand these instructions.  Will watch your condition.  Will get help right away if you are not doing well or get worse. Document Released: 05/28/2001 Document Revised: 12/28/2012 Document Reviewed: 04/20/2008 [facility] Patient Information 2015 [place], [place]. This information is not intended to replace advice given to you by your health care provider. Make sure you discuss any questions you have with your health care provider.

## 2015-01-19 NOTE — ED Provider Notes (Signed)
CSN: [id number]     Arrival date & time 01/19/15  1222 History   First MD Initiated Contact with Patient 01/19/15 1224     Chief Complaint  Patient presents with  . Chest Pain    right ribs   HPI Pt reports accidentally falling this a.m. in her bathroom, coming out of the shower, hitting her right side/ribs on her bathtub. Severe nonradiating sharp right posterior lateral rib Pain with laughing and coughing.  No shortness of breath. No anterior chest pain. Denies right hip pain. No loss of consciousness or neck or spine injury. Denies headache or visual change or focal neurologic symptoms. Currently the right posterior lateral rib pain is 5 out of 10 at rest, 7 out of 10 with movement, inspiration, laughing, or coughing, or twisting. No nausea or vomiting or abdominal pain. No change in bowel habits. No hematuria. Past Medical History  Diagnosis Date  . Allergy    Past Surgical History  Procedure Laterality Date  . Wisdom tooth extraction     Family History  Problem Relation Age of Onset  . Osteoporosis Mother   . Drug abuse Father   . Mental illness Father     bipolar  . Scoliosis Sister   . Cancer Paternal Grandmother     breast and lung  . Diabetes Paternal Grandfather   . Heart attack Paternal Grandfather   . Allergies Sister    History  Substance Use Topics  . Smoking status: Current Every Day Smoker -- 0.50 packs/day    Types: Cigarettes  . Smokeless tobacco: Never Used  . Alcohol Use: Yes   OB History    No data available     Review of Systems Remainder of Review of Systems negative for acute change except as noted in the HPI.  Allergies  Review of patient's allergies indicates no known allergies.  Home Medications   Prior to Admission medications   Medication Sig Start Date End Date Taking? Authorizing Provider  fexofenadine (ALLEGRA) 180 MG tablet Take 180 mg by mouth daily.     Yes Historical Provider, MD  GIANVI 3-0.02 MG tablet TAKE 1 TABLET BY  MOUTH EVERY DAY 08/15/11   Amy Michelle NasutiE Bedsole, MD  HYDROcodone-acetaminophen (NORCO/VICODIN) 5-325 MG per tablet Take 1-2 tablets by mouth every 4 (four) hours as needed for severe pain. Take with food. 01/19/15   Lajean Manesavid Massey, MD  ibuprofen (ADVIL,MOTRIN) 200 MG tablet Take three tablets ( 600 milligrams total) every 6 with food as needed for mild to-moderate pain. 01/19/15   Lajean Manesavid Massey, MD  Multiple Vitamin (MULTIVITAMIN) tablet Take 1 tablet by mouth daily.      Historical Provider, MD   BP 123/81 mmHg  Pulse 89  Temp(Src) 98.2 F (36.8 C) (Oral)  Resp 16  Ht 5\' 7"  (1.702 m)  Wt 252 lb (114.306 kg)  BMI 39.46 kg/m2  SpO2 100%  LMP 01/18/2015 Physical Exam  Constitutional: She is oriented to person, place, and time. She appears well-developed and well-nourished. No distress.  Uncomfortable from right posterior lateral rib pain. She is alert, cooperative. She moves slowly, splinting her right chest.  HENT:  Head: Normocephalic and atraumatic.  Eyes: Conjunctivae and EOM are normal. Pupils are equal, round, and reactive to light. No scleral icterus.  Neck: Normal range of motion.  Cardiovascular: Normal rate, regular rhythm and normal heart sounds.   Pulmonary/Chest: Effort normal and breath sounds normal. No respiratory distress. She has no decreased breath sounds. She has no wheezes. She has  no rales.   She exhibits tenderness.  Abdominal: She exhibits no distension.  Musculoskeletal: Normal range of motion.  Neurological: She is alert and oriented to person, place, and time.  Skin: Skin is warm.  Psychiatric: She has a normal mood and affect.  Nursing note and vitals reviewed.   ED Course  Procedures (including critical care time) Labs Review Labs Reviewed - No data to display  Imaging Review Dg Ribs Unilateral W/chest Right  01/19/2015   CLINICAL DATA:  Right-sided pain following fall  EXAM: RIGHT RIBS AND CHEST - 3+ VIEW  COMPARISON:  Chest radiograph December 01, 2009  FINDINGS:  Frontal chest as well as oblique and cone-down lower rib images obtained. Lungs are clear. Heart size and pulmonary vascularity are normal. No adenopathy. No pneumothorax or effusion. No rib fracture apparent.  IMPRESSION: No demonstrable rib fracture.  Lungs clear.   Electronically Signed   By: Bretta BangWilliam  Woodruff III M.D.   On: 01/19/2015 13:17     MDM   1. Contusion of ribs, right, initial encounter    X-ray right ribs negative. Treatment options discussed, as well as risks, benefits, alternatives. Patient voiced understanding and agreement with the following plans: Rib belt provided, and she voiced that pain significantly improved after the rib belt was placed. Discharge Medication List as of 01/19/2015  1:31 PM    START taking these medications   Details  HYDROcodone-acetaminophen (NORCO/VICODIN) 5-325 MG per tablet Take 1-2 tablets by mouth every 4 (four) hours as needed for severe pain. Take with food., Starting 01/19/2015, Until Discontinued, Print    ibuprofen (ADVIL,MOTRIN) 200 MG tablet Take three tablets ( 600 milligrams total) every 6 with food as needed for mild to-moderate pain., No Print       Ice and other symptomatic care discussed Follow-up with your primary care doctor in 5-7 days if not improving, or sooner if symptoms become worse. Precautions discussed. Red flags discussed. An After Visit Summary was printed and given to the patient.  Questions invited and answered. Patient voiced understanding and agreement.     Lajean Manesavid Massey, MD 01/19/15 [phone]

## 2015-01-19 NOTE — ED Notes (Signed)
Pt reports falling to day in her bathroom hitting her right side/ribs on her bathtub. Pain with laughing and coughing.

## 2015-05-17 ENCOUNTER — Emergency Department
Admission: EM | Admit: 2015-05-17 | Discharge: 2015-05-17 | Disposition: A | Discharge status: Home / Self Care | Payer: 59 | Source: Home / Self Care | Attending: Family Medicine | Admitting: Family Medicine

## 2015-05-17 DIAGNOSIS — J069 Acute upper respiratory infection, unspecified: Secondary | ICD-10-CM

## 2015-05-17 MED ORDER — PSEUDOEPHEDRINE HCL 60 MG PO TABS: ORAL_TABLET | ORAL | Status: DC

## 2015-05-17 MED ORDER — OXYMETAZOLINE HCL 0.05 % NA SOLN: 1.0000 | Freq: Two times a day (BID) | NASAL | Status: DC

## 2015-05-17 MED ORDER — SALINE SPRAY 0.65 % NA SOLN: 1.0000 | NASAL | Status: DC | PRN

## 2015-05-17 NOTE — ED Notes (Signed)
Monday evening started with nasal congestion, discharge greenish on Monday night, felt worse yesterday, and not feeling better today. Using Dayquil and Benadryl

## 2015-05-17 NOTE — ED Provider Notes (Signed)
CSN: [id number]     Arrival date & time 05/17/15  1328 History   First MD Initiated Contact with Patient 05/17/15 1334     Chief Complaint  Patient presents with  . Nasal Congestion   (Consider location/radiation/quality/duration/timing/severity/associated sxs/prior Treatment) HPI Pt is a 29yo female presenting to [facility] with c/o 2 day hx of gradually worsening nasal congestion with "greenish" discharge.  Pt states she has associated bilateral ear soreness, body aches, mild generalized headache, mild sore throat, and mild non-productive cough. She has been using Dayquil every 4-6 hours and taking Benadryl.  Reports fever yesterday 100.8 but no fever today. Denies n/v/d. Denies sick contacts or recent travel.   Past Medical History  Diagnosis Date  . Allergy    Past Surgical History  Procedure Laterality Date  . Wisdom tooth extraction     Family History  Problem Relation Age of Onset  . Osteoporosis Mother   . Drug abuse Father   . Mental illness Father     bipolar  . Diabetes Father   . Hypertension Father   . Scoliosis Sister   . Cancer Paternal Grandmother     breast and lung  . Diabetes Paternal Grandfather   . Heart attack Paternal Grandfather   . Allergies Sister    Social History  Substance Use Topics  . Smoking status: Current Every Day Smoker -- 0.50 packs/day    Types: Cigarettes  . Smokeless tobacco: Never Used  . Alcohol Use: Yes   OB History    No data available     Review of Systems  Constitutional: Positive for fever ( 100.8) and chills.  HENT: Positive for congestion, ear pain, rhinorrhea, sinus pressure, sneezing and sore throat. Negative for trouble swallowing and voice change.   Respiratory: Positive for cough. Negative for shortness of breath, wheezing and stridor.   Cardiovascular: Negative for chest pain, palpitations and leg swelling.  Gastrointestinal: Negative for nausea, vomiting, abdominal pain and diarrhea.  Musculoskeletal: Positive for  myalgias and arthralgias.       Body aches  Neurological: Positive for dizziness and headaches. Negative for seizures, syncope and light-headedness.    Allergies  Review of patient's allergies indicates no known allergies.  Home Medications   Prior to Admission medications   Medication Sig Start Date End Date Taking? Authorizing Provider  loratadine (CLARITIN) 10 MG tablet Take 10 mg by mouth daily.   Yes Historical Provider, MD  fexofenadine (ALLEGRA) 180 MG tablet Take 180 mg by mouth daily.      Historical Provider, MD  GIANVI 3-0.02 MG tablet TAKE 1 TABLET BY MOUTH EVERY DAY 08/15/11   Amy Michelle Nasuti, MD  HYDROcodone-acetaminophen (NORCO/VICODIN) 5-325 MG per tablet Take 1-2 tablets by mouth every 4 (four) hours as needed for severe pain. Take with food. 01/19/15   Lajean Manes, MD  ibuprofen (ADVIL,MOTRIN) 200 MG tablet Take three tablets ( 600 milligrams total) every 6 with food as needed for mild to-moderate pain. 01/19/15   Lajean Manes, MD  Multiple Vitamin (MULTIVITAMIN) tablet Take 1 tablet by mouth daily.      Historical Provider, MD  oxymetazoline (AFRIN NASAL SPRAY) 0.05 % nasal spray Place 1 spray into both nostrils 2 (two) times daily. Use no longer than 5 days 05/17/15   Junius Finner, PA-C  pseudoephedrine (SUDAFED) 60 MG tablet 1 tab every 4-6 hours as needed for nasal congestion 05/17/15   Junius Finner, PA-C  sodium chloride (OCEAN) 0.65 % SOLN nasal spray Place 1 spray into both  nostrils as needed. 05/17/15   Junius Finner, PA-C   Meds Ordered and Administered this Visit  Medications - No data to display  BP 112/77 mmHg  Pulse 110  Temp(Src) 97.8 F (36.6 C) (Oral)  Ht  (1.702 m)  Wt 246 lb 4 oz (111.698 kg)  BMI 38.56 kg/m2  SpO2 98%  LMP 05/05/2015 (Exact Date) No data found.   Physical Exam  Constitutional: She appears well-developed and well-nourished. No distress.  HENT:  Head: Normocephalic and atraumatic.  Right Ear: Hearing, tympanic membrane,  external ear and ear canal normal.  Left Ear: Hearing, tympanic membrane, external ear and ear canal normal.  Nose: Nose normal. Right sinus exhibits no maxillary sinus tenderness and no frontal sinus tenderness. Left sinus exhibits no maxillary sinus tenderness and no frontal sinus tenderness.  Mouth/Throat: Uvula is midline, oropharynx is clear and moist and mucous membranes are normal.  Eyes: Conjunctivae are normal. No scleral icterus.  Neck: Normal range of motion. Neck supple.  Cardiovascular: Normal rate, regular rhythm and normal heart sounds.   Pulmonary/Chest: Effort normal and breath sounds normal. No respiratory distress. She has no wheezes. She has no rales. She exhibits no tenderness.  Abdominal: Soft. She exhibits no distension. There is no tenderness.  Musculoskeletal: Normal range of motion.  Lymphadenopathy:    She has no cervical adenopathy.  Neurological: She is alert.  Skin: Skin is warm and dry. She is not diaphoretic.  Nursing note and vitals reviewed.   ED Course  Procedures (including critical care time)  Labs Review Labs Reviewed - No data to display  Imaging Review No results found.    MDM   1. Acute upper respiratory infection    Pt presenting to [facility] with c/o nasal congestion, sore throat, cough, and body aches that started 2 days ago. Pt is afebrile, non-toxic appearing. Lungs: CTAB. Normal HENT exam. Due to short duration of symptoms and normal findings on exam, will tx conservatively and not with antibiotics at this time.  Rx: sudafed, afrin, and saline nasal spray. Encouraged fluids, rest, and may take acetaminophen or ibuprofen for fever and pain. F/u with PCP in 1 week if not improving.Patient verbalized understanding and agreement with treatment plan.     Junius Finner, PA-C 05/17/15 1400

## 2015-05-17 NOTE — Discharge Instructions (Signed)
You may take 400-600mg  Ibuprofen (Motrin) every 6-8 hours for fever and pain  Alternate with Tylenol  You may take  Tylenol every 4-6 hours as needed for fever and pain  Follow-up with your primary care provider next week for recheck of symptoms if not improving.  Be sure to drink plenty of fluids and rest, at least 8hrs of sleep a night, preferably more while you are sick. Return urgent care or go to closest ER if you cannot keep down fluids/signs of dehydration, fever not reducing with Tylenol, difficulty breathing/wheezing, stiff neck, worsening condition, or other concerns (see below)   [employer] may help relieve the symptoms of a cough and cold. They add moisture to the air, which helps mucus to become thinner and less sticky. This makes it easier to breathe and cough up secretions. Cool mist vaporizers do not cause serious burns like hot mist vaporizers, which may also be called steamers or humidifiers. Vaporizers have not been proven to help with colds. You should not use a vaporizer if you are allergic to mold. HOME CARE INSTRUCTIONS  Follow the package instructions for the vaporizer.  Do not use anything other than distilled water in the vaporizer.  Do not run the vaporizer all of the time. This can cause mold or bacteria to grow in the vaporizer.  Clean the vaporizer after each time it is used.  Clean and dry the vaporizer well before storing it.  Stop using the vaporizer if worsening respiratory symptoms develop. Document Released: 05/30/2004 Document Revised: 09/07/2013 Document Reviewed: 01/20/2013 [facility] Patient Information 2015 [place], [place]. This information is not intended to replace advice given to you by your health care provider. Make sure you discuss any questions you have with your health care provider.

## 2015-08-04 ENCOUNTER — Encounter: Payer: Self-pay | Admitting: Physician Assistant

## 2015-08-04 ENCOUNTER — Ambulatory Visit (INDEPENDENT_AMBULATORY_CARE_PROVIDER_SITE_OTHER): Payer: 59 | Admitting: Physician Assistant

## 2015-08-04 VITALS — BP 120/82 | HR 89 | Ht 67.0 in | Wt 252.0 lb

## 2015-08-04 DIAGNOSIS — J302 Other seasonal allergic rhinitis: Secondary | ICD-10-CM | POA: Insufficient documentation

## 2015-08-04 DIAGNOSIS — Z30011 Encounter for initial prescription of contraceptive pills: Secondary | ICD-10-CM

## 2015-08-04 DIAGNOSIS — Z23 Encounter for immunization: Secondary | ICD-10-CM | POA: Diagnosis not present

## 2015-08-04 DIAGNOSIS — F418 Other specified anxiety disorders: Secondary | ICD-10-CM

## 2015-08-04 DIAGNOSIS — F329 Major depressive disorder, single episode, unspecified: Secondary | ICD-10-CM

## 2015-08-04 DIAGNOSIS — Z9109 Other allergy status, other than to drugs and biological substances: Secondary | ICD-10-CM

## 2015-08-04 DIAGNOSIS — F419 Anxiety disorder, unspecified: Secondary | ICD-10-CM

## 2015-08-04 DIAGNOSIS — Z889 Allergy status to unspecified drugs, medicaments and biological substances status: Secondary | ICD-10-CM

## 2015-08-04 MED ORDER — CITALOPRAM HYDROBROMIDE 10 MG PO TABS
10.0000 mg | ORAL_TABLET | Freq: Every day | ORAL | Status: DC
Start: 2015-08-04 — End: 2015-09-28

## 2015-08-04 MED ORDER — NORETHIN-ETH ESTRAD-FE BIPHAS 1 MG-10 MCG / 10 MCG PO TABS: 1.0000 | ORAL_TABLET | Freq: Every day | ORAL | Status: DC

## 2015-08-04 MED ORDER — MONTELUKAST SODIUM 10 MG PO TABS: 10.0000 mg | ORAL_TABLET | Freq: Every day | ORAL | Status: DC

## 2015-08-04 NOTE — Patient Instructions (Signed)
Citalopram oral solution  What is this medicine?  CITALOPRAM (sye TAL oh pram) is used to treat depression.  This medicine may be used for other purposes; ask your health care provider or pharmacist if you have questions.  What should I tell my health care provider before I take this medicine?  They need to know if you have any of these conditions:  -bipolar disorder or a family history of bipolar disorder  -diabetes  -glaucoma  -heart disease  -history of irregular heartbeat  -kidney or liver disease  -low levels of magnesium or potassium in the blood  -receiving electroconvulsive therapy  -seizures (convulsions)  -suicidal thoughts or a previous suicide attempt  -an unusual or allergic reaction to citalopram, escitalopram, other medicines, foods, dyes, or preservatives  -pregnant or trying to become pregnant  -breast-feeding  How should I use this medicine?  Take this medicine by mouth. Follow the directions on the prescription label. Use a specially marked spoon or container to measure your medicine. Ask your pharmacist if you do not have one. Household spoons are not accurate. This medicine can be taken with or without food. Take your medicine at regular intervals. Do not take your medicine more often than directed. Do not stop taking this medicine suddenly except upon the advice of your doctor. Stopping this medicine too quickly may cause serious side effects or your condition may worsen.  A special MedGuide will be given to you by the pharmacist with each prescription and refill. Be sure to read this information carefully each time.  Talk to your pediatrician regarding the use of this medicine in children. Special care may be needed.  Patients over 60 years old may have a stronger reaction and need a smaller dose.  Overdosage: If you think you have taken too much of this medicine contact a poison control center or emergency room at once.  NOTE: This medicine is only for you. Do not share this medicine with  others.  What if I miss a dose?  If you miss a dose, take it as soon as you can. If it is almost time for your next dose, take only that dose. Do not take double or extra doses.  What may interact with this medicine?  Do not take this medicine with any of the following medications:  -certain medicines for fungal infections like fluconazole, itraconazole, ketoconazole, posaconazole, voriconazole  -cisapride  -dofetilide  -dronedarone  -escitalopram  -linezolid  -MAOIs like Carbex, Eldepryl, Marplan, Nardil, and Parnate  -methylene blue (injected into a vein)  -pimozide  -thioridazine  -ziprasidone  This medicine may also interact with the following medications:  -alcohol  -aspirin and aspirin-like medicines  -carbamazepine  -certain medicines for depression, anxiety, or psychotic disturbances  -certain medicines for infections like chloroquine, clarithromycin, erythromycin, furazolidone, isoniazid, pentamidine  -certain medicines for migraine headaches like almotriptan, eletriptan, frovatriptan, naratriptan, rizatriptan, sumatriptan, zolmitriptan  -certain medicines for sleep  -certain medicines that treat or prevent blood clots like dalteparin, enoxaparin, warfarin  -cimetidine  -diuretics  -fentanyl  -lithium  -methadone  -metoprolol  -NSAIDs, medicines for pain and inflammation, like ibuprofen or naproxen  -omeprazole  -other medicines that prolong the QT interval (cause an abnormal heart rhythm)  -procarbazine  -rasagiline  -supplements like St. John's wort, kava kava, valerian  -tramadol  -tryptophan  This list may not describe all possible interactions. Give your health care provider a list of all the medicines, herbs, non-prescription drugs, or dietary supplements you use. Also tell them if   you smoke, drink alcohol, or use illegal drugs. Some items may interact with your medicine.  What should I watch for while using this medicine?  Tell your doctor if your symptoms do not get better or if they get worse.  Visit your doctor or health care professional for regular checks on your progress. Because it may take several weeks to see the full effects of this medicine, it is important to continue your treatment as prescribed by your doctor.  Patients and their families should watch out for new or worsening thoughts of suicide or depression. Also watch out for sudden changes in feelings such as feeling anxious, agitated, panicky, irritable, hostile, aggressive, impulsive, severely restless, overly excited and hyperactive, or not being able to sleep. If this happens, especially at the beginning of treatment or after a change in dose, call your health care professional.  You may get drowsy or dizzy. Do not drive, use machinery, or do anything that needs mental alertness until you know how this medicine affects you. Do not stand or sit up quickly, especially if you are an older patient. This reduces the risk of dizzy or fainting spells. Alcohol may interfere with the effect of this medicine. Avoid alcoholic drinks.  Your mouth may get dry. Chewing sugarless gum or sucking hard candy, and drinking plenty of water will help. Contact your doctor if the problem does not go away or is severe.  What side effects may I notice from receiving this medicine?  Side effects that you should report to your doctor or health care professional as soon as possible:  -allergic reactions like skin rash, itching or hives, swelling of the face, lips, or tongue  -chest pain  -confusion  -dizziness  -fast, irregular heartbeat  -fast talking and excited feelings or actions that are out of control  -feeling faint or lightheaded, falls  -hallucination, loss of contact with reality  -seizures  -shortness of breath  -suicidal thoughts or other mood changes  -unusual bleeding or bruising  Side effects that usually do not require medical attention (report to your doctor or health care professional if they continue or are bothersome):  -blurred vision  -change  in appetite  -change in sex drive or performance  -headache  -increased sweating  -nausea  -trouble sleeping  This list may not describe all possible side effects. Call your doctor for medical advice about side effects. You may report side effects to FDA at 1-800-FDA-1088.  Where should I keep my medicine?  Keep out of reach of children.  Store at room temperature between 15 and 30 degrees C (59 and 86 degrees F). Throw away any unused medicine after the expiration date.  NOTE: This sheet is a summary. It may not cover all possible information. If you have questions about this medicine, talk to your doctor, pharmacist, or health care provider.     © 2016, Elsevier/[employer] Standard. (2013-03-26 13:10:20)

## 2015-08-04 NOTE — Progress Notes (Signed)
Subjective:    Patient ID: Shelby Cook, female    DOB: 1985/10/13, 29 y.o.   MRN: [id number]  HPI Patient is a 29 year old female who presents to the clinic to establish care.  .. Active Ambulatory Problems    Diagnosis Date Noted  . OBESITY NOS 02/16/2007  . ALLERGIC RHINITIS 02/16/2007  . OLIGOMENORRHEA 02/16/2007  . 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 HEADACHE 07/25/2010  . ABNORMAL PAP SMEAR, LGSIL 07/27/2008   Resolved Ambulatory Problems    Diagnosis Date Noted  . No Resolved Ambulatory Problems   Past Medical History  Diagnosis Date  . Allergy    .Marland Kitchen. Family History  Problem Relation Age of Onset  . Osteoporosis Mother   . Drug abuse Father   . Mental illness Father     bipolar  . Diabetes Father   . Hypertension Father   . Heart attack Father   . Scoliosis Sister   . Cancer Paternal Grandmother     breast and lung  . Diabetes Paternal Grandfather   . Heart attack Paternal Grandfather   . Allergies Sister    .Marland Kitchen. Social History   Social History  . Marital Status: Single    Spouse Name: N/A  . Number of Children: 0  . Years of Education: N/A   Occupational History  .  [employer]   Social History Main Topics  . Smoking status: Former Smoker -- 0.50 packs/day    Types: Cigarettes    Quit date: 06/11/2015  . Smokeless tobacco: Never Used  . Alcohol Use: 0.0 oz/week    0 Standard drinks or equivalent per week  . Drug Use: No  . Sexual Activity: Yes    Birth Control/ Protection: Pill   Other Topics Concern  . Not on file   Social History Narrative   Regular exercise yes, joining [employer]   Diet: Avoiding sugar,fruits and veggies   Single 6 month relationship no sex   She would like to talk about birth control for pregnancy prevention. She got married in September and needs birth control. Her last Pap was in 2013. She is due for another Pap. She denies any family history of blood clots or clotting disorders.LMP 06/04/15  Patient is taking Zyrtec for her  allergies. She alternates between Zyrtec and Claritin. She has a history of allergic rhinitis, allergic conjunctivitis and rhinorrhea. She was previously on allergy shots by an allergist but lost her insurance. She would like to see if there is anything different she can do to help her allergic symptoms.  Patient would also like to discuss her anxiety. She has struggled with anxiety her whole life. Her mother did not believe the medication and always just told her it would get better. She is now an adult she feels very anxious and stressed. She is exhausted when she comes home from work because she is worried the whole day. She is very irritable. She has no energy. She often feels hopeless. She feels on edge all the time. She denies any feelings of suicide or homicide.   Review of Systems  All other systems reviewed and are negative.      Objective:   Physical Exam  Constitutional: She is oriented to person, place, and time. She appears well-developed and well-nourished.  Cardiovascular: Normal rate, regular rhythm and normal heart sounds.   Pulmonary/Chest: Effort normal and breath sounds normal.  Neurological: She is alert and oriented to person, place, and time.  Psychiatric: She has a normal mood and affect.  Her behavior is normal.          Assessment & Plan:  Contraception exception counseling-we'll start low Loestrin today. Discussed potential side effects. Patient is in need of a Pap smear. Schedule the next 2 months. Discussed when to start birth control pills and how to take them.  Multiple allergies/seasonal allergies-patient is a 32 taken Zyrtec. Will add Singulair. We'll see if there is any improvement in the next 2 months. Also discussed Flonase over-the-counter 2 sprays each nostril. We can always refer to an allergist to investigate shots again.  Anxiety/depression-GAD-7 was 11. PHQ-9 was 16. Discuss her depression score was even higher than her anxiety score both in the  moderate range. We'll start Celexa 10 mg at bedtime. Discussed potential side effects. Discussed the patient may take 4-6 weeks to become therapeutic. Cough there is any worsening anxiety or depression. Follow-up in 4-6 weeks.  Flu shot given without complications today.

## 2015-08-16 ENCOUNTER — Ambulatory Visit (INDEPENDENT_AMBULATORY_CARE_PROVIDER_SITE_OTHER): Payer: 59 | Admitting: Physician Assistant

## 2015-08-16 ENCOUNTER — Encounter: Payer: Self-pay | Admitting: Physician Assistant

## 2015-08-16 VITALS — BP 130/69 | HR 79 | Ht 67.0 in | Wt 252.0 lb

## 2015-08-16 DIAGNOSIS — Z9109 Other allergy status, other than to drugs and biological substances: Secondary | ICD-10-CM

## 2015-08-16 DIAGNOSIS — J0100 Acute maxillary sinusitis, unspecified: Secondary | ICD-10-CM

## 2015-08-16 DIAGNOSIS — J302 Other seasonal allergic rhinitis: Secondary | ICD-10-CM | POA: Diagnosis not present

## 2015-08-16 DIAGNOSIS — Z91048 Other nonmedicinal substance allergy status: Secondary | ICD-10-CM

## 2015-08-16 MED ORDER — NORETHIN ACE-ETH ESTRAD-FE 1-20 MG-MCG PO TABS: 1.0000 | ORAL_TABLET | Freq: Every day | ORAL | Status: DC

## 2015-08-16 MED ORDER — AMOXICILLIN-POT CLAVULANATE 875-125 MG PO TABS: 1.0000 | ORAL_TABLET | Freq: Two times a day (BID) | ORAL | Status: DC

## 2015-08-16 MED ORDER — AZELASTINE HCL 0.1 % NA SOLN: 2.0000 | Freq: Two times a day (BID) | NASAL | Status: DC

## 2015-08-18 ENCOUNTER — Encounter: Payer: Self-pay | Admitting: Physician Assistant

## 2015-08-18 DIAGNOSIS — Z9109 Other allergy status, other than to drugs and biological substances: Secondary | ICD-10-CM | POA: Insufficient documentation

## 2015-08-18 NOTE — Progress Notes (Signed)
   Subjective:    Patient ID: Shelby Cook, female    DOB: [birth year], 29 y.o.   MRN: [id number]  HPI Pt is a 29 yo female who presents to the clinic with sinus pressure, nasal congestion for last 2-3 weeks. Always starts with allergies. Taking Singulair, zyrtec and nasal spray. flonase causes nose bleeds. Increase in sinus pressure and headaches. No fever, chills, ST, rash, cough, wheezing or SOB. Hx of recurrent sinus infection due to allergies. Having some itching eyes.    Review of Systems  All other systems reviewed and are negative.      Objective:   Physical Exam  Constitutional: She is oriented to person, place, and time. She appears well-developed and well-nourished.  HENT:  Head: Normocephalic and atraumatic.  Right Ear: External ear normal.  Left Ear: External ear normal.  Mouth/Throat: Oropharynx is clear and moist.  TM's erythematous. Good light reflex, no blood or pus.   Maxillary sinuses tender to palpation bilaterally.   Bilateral nares red and swollen.   Eyes: Conjunctivae are normal. Right eye exhibits no discharge. Left eye exhibits no discharge.  Neck: Normal range of motion. Neck supple.  Cardiovascular: Normal rate, regular rhythm and normal heart sounds.   Pulmonary/Chest: Effort normal and breath sounds normal. She has no wheezes.  Lymphadenopathy:    She has no cervical adenopathy.  Neurological: She is alert and oriented to person, place, and time.  Skin:  Bilateral flushed cheeks.   Psychiatric: She has a normal mood and affect. Her behavior is normal.          Assessment & Plan:  Acute maxillary sinusitis/environmental allergies/seasonal allergies- seems her ongoing allergies are causing recurrent sinus infections. Will refer to allergist to potentially get back on shots. Continue zyrtec/singular/nasal saline added astelin to try since flonase causes nose bleeds. Treated today with augmentin. If not improving consider addition of steroid.  Follow up as needed.

## 2015-09-28 ENCOUNTER — Other Ambulatory Visit: Payer: Self-pay | Admitting: Physician Assistant

## 2015-10-23 ENCOUNTER — Encounter: Payer: Self-pay | Admitting: Physician Assistant

## 2015-10-23 ENCOUNTER — Other Ambulatory Visit: Payer: Self-pay | Admitting: Physician Assistant

## 2015-10-23 ENCOUNTER — Other Ambulatory Visit (HOSPITAL_COMMUNITY)
Admission: RE | Admit: 2015-10-23 | Discharge: 2015-10-23 | Disposition: A | Discharge status: Home / Self Care | Payer: 59 | Source: Ambulatory Visit | Attending: Physician Assistant | Admitting: Physician Assistant

## 2015-10-23 ENCOUNTER — Ambulatory Visit (INDEPENDENT_AMBULATORY_CARE_PROVIDER_SITE_OTHER): Payer: 59

## 2015-10-23 ENCOUNTER — Ambulatory Visit (INDEPENDENT_AMBULATORY_CARE_PROVIDER_SITE_OTHER): Payer: 59 | Admitting: Physician Assistant

## 2015-10-23 VITALS — BP 116/60 | HR 80 | Ht 67.0 in | Wt 259.0 lb

## 2015-10-23 DIAGNOSIS — Z01419 Encounter for gynecological examination (general) (routine) without abnormal findings: Secondary | ICD-10-CM

## 2015-10-23 DIAGNOSIS — F418 Other specified anxiety disorders: Secondary | ICD-10-CM

## 2015-10-23 DIAGNOSIS — IMO0002 Reserved for concepts with insufficient information to code with codable children: Secondary | ICD-10-CM

## 2015-10-23 DIAGNOSIS — F329 Major depressive disorder, single episode, unspecified: Secondary | ICD-10-CM

## 2015-10-23 DIAGNOSIS — R896 Abnormal cytological findings in specimens from other organs, systems and tissues: Secondary | ICD-10-CM

## 2015-10-23 DIAGNOSIS — M25561 Pain in right knee: Secondary | ICD-10-CM

## 2015-10-23 DIAGNOSIS — N760 Acute vaginitis: Secondary | ICD-10-CM | POA: Diagnosis present

## 2015-10-23 DIAGNOSIS — F419 Anxiety disorder, unspecified: Secondary | ICD-10-CM

## 2015-10-23 DIAGNOSIS — Z114 Encounter for screening for human immunodeficiency virus [HIV]: Secondary | ICD-10-CM | POA: Diagnosis not present

## 2015-10-23 DIAGNOSIS — Z131 Encounter for screening for diabetes mellitus: Secondary | ICD-10-CM

## 2015-10-23 DIAGNOSIS — Z113 Encounter for screening for infections with a predominantly sexual mode of transmission: Secondary | ICD-10-CM | POA: Diagnosis present

## 2015-10-23 DIAGNOSIS — F32A Depression, unspecified: Secondary | ICD-10-CM

## 2015-10-23 DIAGNOSIS — R635 Abnormal weight gain: Secondary | ICD-10-CM | POA: Insufficient documentation

## 2015-10-23 DIAGNOSIS — R6889 Other general symptoms and signs: Secondary | ICD-10-CM

## 2015-10-23 DIAGNOSIS — Z13220 Encounter for screening for lipoid disorders: Secondary | ICD-10-CM

## 2015-10-23 DIAGNOSIS — Z0001 Encounter for general adult medical examination with abnormal findings: Secondary | ICD-10-CM | POA: Diagnosis not present

## 2015-10-23 LAB — COMPLETE METABOLIC PANEL WITH GFR
ALBUMIN: 3.6 g/dL (ref 3.6–5.1)
ALK PHOS: 68 U/L (ref 33–115)
ALT: 10 U/L (ref 6–29)
AST: 15 U/L (ref 10–30)
BILIRUBIN TOTAL: 0.3 mg/dL (ref 0.2–1.2)
BUN: 9 mg/dL (ref 7–25)
CALCIUM: 8.9 mg/dL (ref 8.6–10.2)
CO2: 22 mmol/L (ref 20–31)
Chloride: 104 mmol/L (ref 98–110)
Creat: 0.71 mg/dL (ref 0.50–1.10)
GFR, Est African American: >89 mL/min (ref >=60)
GFR, Est Non African American: >89 mL/min (ref >=60)
Glucose, Bld: 99 mg/dL (ref 65–99)
POTASSIUM: 4.4 mmol/L (ref 3.5–5.3)
Sodium: 139 mmol/L (ref 135–146)
Total Protein: 6.8 g/dL (ref 6.1–8.1)

## 2015-10-23 LAB — LIPID PANEL
Cholesterol: 175 mg/dL (ref 125–200)
HDL: 48 mg/dL (ref >=46)
LDL CALC: 109 mg/dL (ref <130)
Total CHOL/HDL Ratio: 3.6 Ratio (ref <=5.0)
Triglycerides: 91 mg/dL (ref <150)
VLDL: 18 mg/dL (ref <30)

## 2015-10-23 LAB — TSH: TSH: 1.3 m[IU]/L

## 2015-10-23 MED ORDER — CITALOPRAM HYDROBROMIDE 20 MG PO TABS: 20.0000 mg | ORAL_TABLET | Freq: Every day | ORAL | Status: DC

## 2015-10-23 MED ORDER — MELOXICAM 15 MG PO TABS: ORAL_TABLET | ORAL | Status: DC

## 2015-10-23 MED ORDER — NORETHIN ACE-ETH ESTRAD-FE 1-20 MG-MCG PO TABS: 1.0000 | ORAL_TABLET | Freq: Every day | ORAL | Status: DC

## 2015-10-23 MED ORDER — MONTELUKAST SODIUM 10 MG PO TABS
10.0000 mg | ORAL_TABLET | Freq: Every day | ORAL | Status: DC
Start: 2015-10-23 — End: 2016-11-01

## 2015-10-23 MED ORDER — PHENTERMINE HCL 37.5 MG PO TABS: 37.5000 mg | ORAL_TABLET | Freq: Every day | ORAL | Status: DC

## 2015-10-23 NOTE — Patient Instructions (Addendum)
Keeping You Healthy  Get These Tests 1. Blood Pressure- Have your blood pressure checked once a year by your health care provider.  Normal blood pressure is 120/80. 2. Weight- Have your body mass index (BMI) calculated to screen for obesity.  BMI is measure of body fat based on height and weight.  You can also calculate your own BMI at https://www.west-esparza.com/. 3. Cholesterol- Have your cholesterol checked every 5 years starting at age 28 then yearly starting at age 14. 4. Chlamydia, HIV, and other sexually transmitted diseases- Get screened every year until age 11, then within three months of each new sexual provider. 5. Pap Test - Every 1-5 years; discuss with your health care provider. 6. Mammogram- Every 1-2 years starting at age 32--50  Take these medicines  Calcium with Vitamin D-Your body needs 1200 mg of Calcium each day and [phone] IU of Vitamin D daily.  Your body can only absorb 500 mg of Calcium at a time so Calcium must be taken in 2 or 3 divided doses throughout the day.  Multivitamin with folic acid- Once daily if it is possible for you to become pregnant.  Get these Immunizations  Gardasil-Series of three doses; prevents HPV related illness such as genital warts and cervical cancer.  Menactra-Single dose; prevents meningitis.  Tetanus shot- Every 10 years.  Flu shot-Every year.  Take these steps 1. Do not smoke-Your healthcare provider can help you quit.  For tips on how to quit go to www.smokefree.gov or call 1-800 QUITNOW. 2. Be physically active- Exercise 5 days a week for at least 30 minutes.  If you are not already physically active, start slow and gradually work up to 30 minutes of moderate physical activity.  Examples of moderate activity include walking briskly, dancing, swimming, bicycling, etc. 3. Breast Cancer- A self breast exam every month is important for early detection of breast cancer.  For more information and instruction on self breast exams, ask your  healthcare provider or [email or web address]. 4. Eat a healthy diet- Eat a variety of healthy foods such as fruits, vegetables, whole grains, low fat milk, low fat cheeses, yogurt, lean meats, poultry and fish, beans, nuts, tofu, etc.  For more information go to www. Thenutritionsource.org 5. Drink alcohol in moderation- Limit alcohol intake to one drink or less per day. Never drink and drive. 6. Depression- Your emotional health is as important as your physical health.  If you're feeling down or losing interest in things you normally enjoy please talk to your healthcare provider about being screened for depression. 7. Dental visit- Brush and floss your teeth twice daily; visit your dentist twice a year. 8. Eye doctor- Get an eye exam at least every 2 years. 9. Helmet use- Always wear a helmet when riding a bicycle, motorcycle, rollerblading or skateboarding. 10. Safe sex- If you may be exposed to sexually transmitted infections, use a condom. 11. Seat belts- Seat belts can save your live; always wear one. 12. Smoke/Carbon Monoxide detectors- These detectors need to be installed on the appropriate level of your home. Replace batteries at least once a year. 13. Skin cancer- When out in the sun please cover up and use sunscreen 15 SPF or higher. 14. Violence- If anyone is threatening or hurting you, please tell your healthcare provider.      Saxenda, belviq, contrave, topamax

## 2015-10-23 NOTE — Progress Notes (Signed)
Subjective:     Aliani Caccavale is a 30 y.o. female and is here for a comprehensive physical exam. The patient reports problems - see below.. Patient is a 30 year old female presenting for a complete physical. Patient's concerns for this visit include addressing weight concerns, completing a pap smear, a breast exam, addressing right knee pain, and obtaining a refill on Celexa. Patient states that she has been trying to loose weight by walking more in the evenings and joining [employer]. Patient has also improved her diet by eating more cooked meals at home with her husband. Patient was interested in gathering information about a pharmacotherapy regimen that would help facilitate weight loss. Patient states that her menstrual cycle has been regular. She is currently taking birth control with no concerns or complications. Patient states that her last pap smear revealed findings consistent with dysplasia, this was approximately 3 years ago. She is on OCP.  Patient states that she conducts regular breast exams and has no recent findings of lumps or masses. Patient's most recent PHQ 9 reveals a score of 10. Patient states that she has recently doubled her 10 mg dose of Celexa, which yielded significant improvement in depressive symptoms. Patient states that she has non-radiating, dull 6/10 right knee pain since last Tuesday when she fell after attempting to restrain a dog. Patient states that pain is worsened with walking and climbing stairs. Patient states that she has been taking Advil, which has provided limited relief.    Social History   Social History  . Marital Status: Single    Spouse Name: N/A  . Number of Children: 0  . Years of Education: N/A   Occupational History  .  [employer]   Social History Main Topics  . Smoking status: Former Smoker -- 0.50 packs/day    Types: Cigarettes    Quit date: 06/11/2015  . Smokeless tobacco: Never Used  . Alcohol Use: 0.0 oz/week    0  Standard drinks or equivalent per week  . Drug Use: No  . Sexual Activity: Yes    Birth Control/ Protection: Pill   Other Topics Concern  . Not on file   Social History Narrative   Regular exercise yes, joining [employer]   Diet: Avoiding sugar,fruits and veggies   Single 6 month relationship no sex   Health Maintenance  Topic Date Due  . HIV Screening  01/08/2003  . PAP SMEAR  04/10/2015  . INFLUENZA VACCINE  04/16/2016  . TETANUS/TDAP  07/25/2020    The following portions of the patient's history were reviewed and updated as appropriate: allergies, current medications, past family history, past medical history, past social history, past surgical history and problem list.  Review of Systems Pertinent items noted in HPI and remainder of comprehensive ROS otherwise negative.   Objective:    BP 116/60 mmHg  Pulse 80  Ht  (1.702 m)  Wt 259 lb (117.482 kg)  BMI 40.56 kg/m2 General appearance: alert, cooperative, appears stated age and morbidly obese Head: Normocephalic, without obvious abnormality, atraumatic Eyes: conjunctivae/corneas clear. PERRL, EOM's intact. Fundi benign. Ears: normal TM's and external ear canals both ears Nose: Nares normal. Septum midline. Mucosa normal. No drainage or sinus tenderness. Throat: lips, mucosa, and tongue normal; teeth and gums normal Neck: no adenopathy, no carotid bruit, no JVD, supple, symmetrical, trachea midline and thyroid not enlarged, symmetric, no tenderness/mass/nodules Back: symmetric, no curvature. ROM normal. No CVA tenderness. Lungs: clear to auscultation bilaterally Breasts: normal appearance, no masses  or tenderness Heart: regular rate and rhythm, S1, S2 normal, no murmur, click, rub or gallop Abdomen: soft, non-tender; bowel sounds normal; no masses,  no organomegaly Pelvic: cervix normal in appearance, exam obscured by obesity, external genitalia normal, no adnexal masses or tenderness, no cervical motion tenderness,  uterus normal size, shape, and consistency and vagina normal without discharge Extremities: extremities normal, atraumatic, no cyanosis or edema right knee effusion noted, tender to palpation over and under patella, no brusing or redness, good stability of knee, negative anterior drawers and mcmurrays, normal ROM of right knee Pulses: 2+ and symmetric Skin: Skin color, texture, turgor normal. No rashes or lesions Lymph nodes: Cervical, supraclavicular, and axillary nodes normal. Neurologic: Grossly normal    Assessment:    Healthy female exam.      Plan:    CPE- pap done today. HIV, STD testing,lipid, cmp ordered today. Discussed weight loss see below. Encouraged vitamin d 800 units and calcium .   Anxiety and depression- PHQ-9 was 10. Increased celexa to . Pt feels great. Will refill for 6 months.   Right knee pain- xray done which so no acute abnormality. Discussed with pt sounds more like patella-femoral syndrome. Exercises given. Discussed iceing and added mobic to replace ibuprofen. Discussed OTC brace for light compression and stability. Follow up if not improving in next 2 weeks.   Obese- phentermine given. Discussed side effects. Discussed other medications such as saxenda. Calorie restriction to 1500 a day. Discussed exercise and other weight loss ideas.  Pt will follow up at 1 month. TSH ordered.   Seasonal allergies- refilled Singulair for 1 year. asteilin as needed.    See After Visit Summary for Counseling Recommendations

## 2015-10-24 LAB — HIV ANTIBODY (ROUTINE TESTING W REFLEX): HIV: NONREACTIVE

## 2015-10-25 LAB — CYTOLOGY - PAP

## 2015-10-26 ENCOUNTER — Other Ambulatory Visit: Payer: Self-pay

## 2015-10-26 LAB — CERVICOVAGINAL ANCILLARY ONLY
Bacterial vaginitis: NEGATIVE
Candida vaginitis: POSITIVE — AB

## 2015-10-26 MED ORDER — FLUCONAZOLE 150 MG PO TABS: 150.0000 mg | ORAL_TABLET | Freq: Once | ORAL | Status: DC

## 2015-10-27 LAB — CERVICOVAGINAL ANCILLARY ONLY: HERPES (WINDOWPATH): NEGATIVE

## 2015-11-20 ENCOUNTER — Ambulatory Visit: Payer: 59

## 2015-12-25 ENCOUNTER — Encounter: Payer: Self-pay | Admitting: *Deleted

## 2015-12-25 ENCOUNTER — Emergency Department
Admission: EM | Admit: 2015-12-25 | Discharge: 2015-12-25 | Disposition: A | Discharge status: Home / Self Care | Payer: 59 | Source: Home / Self Care | Attending: Family Medicine | Admitting: Family Medicine

## 2015-12-25 DIAGNOSIS — R197 Diarrhea, unspecified: Secondary | ICD-10-CM | POA: Diagnosis not present

## 2015-12-25 DIAGNOSIS — R110 Nausea: Secondary | ICD-10-CM

## 2015-12-25 LAB — POCT CBC W AUTO DIFF (K'VILLE URGENT CARE)

## 2015-12-25 MED ORDER — ONDANSETRON 4 MG PO TBDP: 4.0000 mg | ORAL_TABLET | Freq: Three times a day (TID) | ORAL | Status: DC | PRN

## 2015-12-25 NOTE — ED Provider Notes (Signed)
CSN: [id number]     Arrival date & time 12/25/15  1227 History   First MD Initiated Contact with Patient 12/25/15 1309     Chief Complaint  Patient presents with  . Diarrhea  . Emesis      HPI Comments: 5 days ago patient developed nausea and an episode of vomiting, followed by watery diarrhea.  She has had colicky abdominal pain, improved after having diarrhea.   She has had persistent chills and fatigue.  She has had a second episode of nausea/vomiting and her diarrhea persists, worse after eating.  Denies recent foreign travel, or drinking untreated water in a wilderness environment.  She denies recent antibiotic use.   The history is provided by the patient.    Past Medical History  Diagnosis Date  . Allergy    Past Surgical History  Procedure Laterality Date  . Wisdom tooth extraction     Family History  Problem Relation Age of Onset  . Osteoporosis Mother   . Drug abuse Father   . Mental illness Father     bipolar  . Diabetes Father   . Hypertension Father   . Heart attack Father   . Scoliosis Sister   . Cancer Paternal Grandmother     breast and lung  . Diabetes Paternal Grandfather   . Heart attack Paternal Grandfather   . Allergies Sister    Social History  Substance Use Topics  . Smoking status: Former Smoker -- 0.50 packs/day    Types: Cigarettes    Quit date: 06/11/2015  . Smokeless tobacco: Never Used  . Alcohol Use: 0.0 oz/week    0 Standard drinks or equivalent per week   OB History    No data available     Review of Systems  Allergies  Flonase  Home Medications   Prior to Admission medications   Medication Sig Start Date End Date Taking? Authorizing Provider  citalopram (CELEXA) 20 MG tablet Take 1 tablet (20 mg total) by mouth daily. 10/23/15  Yes Jade L Breeback, PA-C  montelukast (SINGULAIR) 10 MG tablet Take 1 tablet (10 mg total) by mouth at bedtime. 10/23/15  Yes Jade L Breeback, PA-C  Multiple Vitamin (MULTIVITAMIN) tablet Take 1 tablet  by mouth daily.     Yes Historical Provider, MD  norethindrone-ethinyl estradiol (JUNEL FE 1/20) 1-20 MG-MCG tablet Take 1 tablet by mouth daily. 10/23/15  Yes Jade L Breeback, PA-C  azelastine (ASTELIN) 0.1 % nasal spray Place 2 sprays into both nostrils 2 (two) times daily. Use in each nostril as directed Patient taking differently: Place 2 sprays into both nostrils as needed. Use in each nostril as directed 08/16/15   Jade L Breeback, PA-C  ondansetron (ZOFRAN ODT) 4 MG disintegrating tablet Take 1 tablet (4 mg total) by mouth every 8 (eight) hours as needed for nausea or vomiting. Dissolve under tongue 12/25/15   Lattie Haw, MD   Meds Ordered and Administered this Visit  Medications - No data to display  BP 109/74 mmHg  Pulse 78  Temp(Src) 98.2 F (36.8 C) (Oral)  Wt 254 lb (115.214 kg)  SpO2 99%  LMP 12/23/2015 No data found.   Physical Exam Nursing notes and Vital Signs reviewed. Appearance:  Patient appears stated age, and in no acute distress Eyes:  Pupils are equal, round, and reactive to light and accomodation.  Extraocular movement is intact.  Conjunctivae are not inflamed  Nose:  Normal Mouth/Pharynx:  Normal; moist mucous membranes  Neck:  Supple.  No adenopathy Lungs:  Clear to auscultation.  Breath sounds are equal.  Moving air well. Heart:  Regular rate and rhythm without murmurs, rubs, or gallops.  Abdomen:  Nontender without masses or hepatosplenomegaly.  Bowel sounds are present.  No CVA or flank tenderness.  Extremities:  No edema.  Skin:  No rash present.   ED Course  Procedures none    Labs Reviewed  POCT CBC W AUTO DIFF ([facility]):  WBC 9.0; LY 33.8; MO 3.3; GR 62.9; Hgb 13.9; Platelets 346      MDM   1. Diarrhea, unspecified type; suspect viral gastroenteritis  2. Nausea without vomiting    Normal white blood count reassuring  Rx for Zofran ODT 4mg  Begin clear liquids (Pedialyte while having diarrhea) until improved, then advance to a  SUPERVALU INCBRAT diet (Bananas, Rice, Applesauce, Toast).  Then gradually resume a regular diet when tolerated.  Avoid milk products until well.  To decrease diarrhea, mix one teaspoon Citrucel (methylcellulose) in 2 oz water and drink one to three times daily.  Do not drink extra fluids with this dose and do not drink fluids for one hour afterwards.  When stools become more formed, may take Imodium (loperamide) once or twice daily to decrease stool frequency.  If symptoms become significantly worse during the night or over the weekend, proceed to the local emergency room.     Lattie HawStephen A Amalee Olsen, MD 12/25/15 [phone]

## 2015-12-25 NOTE — Discharge Instructions (Signed)

## 2015-12-25 NOTE — ED Notes (Signed)
Pt c/o 4 days of N/V/D with upper abdominal pain. Possible fever on night 2 and 3. Able to hold down some white rice.

## 2016-03-07 ENCOUNTER — Other Ambulatory Visit: Payer: Self-pay | Admitting: Physician Assistant

## 2016-03-18 ENCOUNTER — Encounter: Payer: Self-pay | Admitting: Physician Assistant

## 2016-03-18 ENCOUNTER — Ambulatory Visit (INDEPENDENT_AMBULATORY_CARE_PROVIDER_SITE_OTHER): Payer: 59 | Admitting: Physician Assistant

## 2016-03-18 VITALS — BP 122/69 | HR 84 | Ht 67.0 in | Wt 254.0 lb

## 2016-03-18 DIAGNOSIS — Z91048 Other nonmedicinal substance allergy status: Secondary | ICD-10-CM

## 2016-03-18 DIAGNOSIS — Z889 Allergy status to unspecified drugs, medicaments and biological substances status: Secondary | ICD-10-CM

## 2016-03-18 DIAGNOSIS — J302 Other seasonal allergic rhinitis: Secondary | ICD-10-CM

## 2016-03-18 DIAGNOSIS — L509 Urticaria, unspecified: Secondary | ICD-10-CM

## 2016-03-18 DIAGNOSIS — Z9109 Other allergy status, other than to drugs and biological substances: Secondary | ICD-10-CM

## 2016-03-18 LAB — CBC WITH DIFFERENTIAL/PLATELET
BASOS PCT: 0 %
Basophils Absolute: 0 cells/uL (ref 0–200)
EOS ABS: 357 {cells}/uL (ref 15–500)
Eosinophils Relative: 3 %
HCT: 41 % (ref 35.0–45.0)
Hemoglobin: 13.8 g/dL (ref 11.7–15.5)
Lymphocytes Relative: 24 %
Lymphs Abs: 2856 cells/uL (ref 850–3900)
MCH: 30.6 pg (ref 27.0–33.0)
MCHC: 33.7 g/dL (ref 32.0–36.0)
MCV: 90.9 fL (ref 80.0–100.0)
MONO ABS: 595 {cells}/uL (ref 200–950)
MPV: 9.9 fL (ref 7.5–12.5)
Monocytes Relative: 5 %
NEUTROS ABS: 8092 {cells}/uL — AB (ref 1500–7800)
Neutrophils Relative %: 68 %
Platelets: 344 10*3/uL (ref 140–400)
RBC: 4.51 MIL/uL (ref 3.80–5.10)
RDW: 13.6 % (ref 11.0–15.0)
WBC: 11.9 10*3/uL — AB (ref 3.8–10.8)

## 2016-03-18 LAB — COMPLETE METABOLIC PANEL WITH GFR
ALT: 15 U/L (ref 6–29)
AST: 18 U/L (ref 10–30)
Albumin: 3.8 g/dL (ref 3.6–5.1)
Alkaline Phosphatase: 71 U/L (ref 33–115)
BILIRUBIN TOTAL: 0.5 mg/dL (ref 0.2–1.2)
BUN: 7 mg/dL (ref 7–25)
CALCIUM: 9.1 mg/dL (ref 8.6–10.2)
CHLORIDE: 104 mmol/L (ref 98–110)
CO2: 25 mmol/L (ref 20–31)
Creat: 0.62 mg/dL (ref 0.50–1.10)
GFR, Est Non African American: >89 mL/min (ref >=60)
Glucose, Bld: 89 mg/dL (ref 65–99)
Potassium: 4.3 mmol/L (ref 3.5–5.3)
Sodium: 139 mmol/L (ref 135–146)
TOTAL PROTEIN: 6.6 g/dL (ref 6.1–8.1)

## 2016-03-18 MED ORDER — PREDNISONE 50 MG PO TABS: ORAL_TABLET | ORAL | Status: DC

## 2016-03-18 MED ORDER — EPINEPHRINE 0.3 MG/0.3ML IJ SOAJ: 0.3000 mg | Freq: Once | INTRAMUSCULAR | Status: DC

## 2016-03-18 MED ORDER — HYDROXYZINE HCL 25 MG PO TABS: 25.0000 mg | ORAL_TABLET | Freq: Three times a day (TID) | ORAL | Status: DC | PRN

## 2016-03-18 NOTE — Patient Instructions (Signed)
Stop celexa 

## 2016-03-18 NOTE — Progress Notes (Signed)
   Subjective:    Patient ID: Shelby Cook, female    DOB: May 21, 1986, 30 y.o.   MRN: [id number]  HPI  Pt is a 30 yo female with PMH of allergic rhinitis, seaonal allergies, and environmental allergies who presents to the clinic with hives and itching that come up every where on her body off and on for last week and a half. Has not started any new medications. She does note that feels like celex causes her hands to ache. When she does not take her hands do not ache. She has switched soaps and detergent to hypo-allergenic. No known exposure. She woke up in hives yesterday morning. On zyrtec and singulair. One day her tongue did feel like it was swollen. No SOB, wheezing or dysphagia. Hx of allergy shots but insucance previously woulld not pay for any more. 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   Review of Systems  All other systems reviewed and are negative.      Objective:   Physical Exam  Constitutional: She is oriented to person, place, and time. She appears well-developed and well-nourished.  HENT:  Head: Normocephalic and atraumatic.  Cardiovascular: Normal rate, regular rhythm and normal heart sounds.   Pulmonary/Chest: Effort normal and breath sounds normal.  Neurological: She is alert and oriented to person, place, and time.  Psychiatric: She has a normal mood and affect. Her behavior is normal.          Assessment & Plan:  Hives/urticaria- unclear etiology. Pt does have numerous allergies and hx of allergy shots. Possible side effect/intolerance to celexa. Stop celexa. Prednisone burst given for 5 days. Continue on singulair and zyrtec daily. vistaril given as needed for itching. Will make allergy referral. Since she did have one episode of tongue swelling. Sent epi-pen to use as needed for anaphaxtic response if these were to increase in intensity. Cbc and cmp ordered. stongly encouraged to stay away from CATS.

## 2016-03-18 NOTE — Addendum Note (Signed)
Addended by: Jomarie LongsBREEBACK, JADE L on: 03/18/2016 02:00 PM   Modules accepted: Orders

## 2016-03-29 ENCOUNTER — Telehealth: Payer: Self-pay | Admitting: *Deleted

## 2016-03-29 NOTE — Telephone Encounter (Signed)
Patient stopped Celexa due to an allergic reaction. Patient would like to know what other antidepressant can be prescribed for her.Please advise

## 2016-03-29 NOTE — Telephone Encounter (Signed)
We can certainly try something else. Is there anything that she has taken in the past that we need to avoid as well?

## 2016-04-01 NOTE — Telephone Encounter (Signed)
Left message to let us know if any allergies/intolerance to any other antidepressants taken in the past

## 2016-04-03 ENCOUNTER — Ambulatory Visit (INDEPENDENT_AMBULATORY_CARE_PROVIDER_SITE_OTHER): Payer: 59

## 2016-04-03 ENCOUNTER — Encounter: Payer: Self-pay | Admitting: Physician Assistant

## 2016-04-03 ENCOUNTER — Ambulatory Visit (INDEPENDENT_AMBULATORY_CARE_PROVIDER_SITE_OTHER): Payer: 59 | Admitting: Physician Assistant

## 2016-04-03 DIAGNOSIS — M436 Torticollis: Secondary | ICD-10-CM

## 2016-04-03 DIAGNOSIS — M546 Pain in thoracic spine: Secondary | ICD-10-CM | POA: Diagnosis not present

## 2016-04-03 DIAGNOSIS — M542 Cervicalgia: Secondary | ICD-10-CM | POA: Diagnosis not present

## 2016-04-03 DIAGNOSIS — M549 Dorsalgia, unspecified: Secondary | ICD-10-CM

## 2016-04-03 MED ORDER — MELOXICAM 15 MG PO TABS: 15.0000 mg | ORAL_TABLET | Freq: Every day | ORAL | Status: DC

## 2016-04-03 MED ORDER — KETOROLAC TROMETHAMINE 60 MG/2ML IM SOLN
60.0000 mg | Freq: Once | INTRAMUSCULAR | Status: AC
  Administered 2016-04-03: 60 mg via INTRAMUSCULAR
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
## 2016-04-03 NOTE — Patient Instructions (Signed)
toradol shot today.  Stop ibuprofen. Start mobic daily for next 3-5 days as needed.  Try to do exercises twice a day. Heat before and biofreeze after. Once that has dried you can use pennsaid directly  Over site of pain.   7 days

## 2016-04-27 ENCOUNTER — Other Ambulatory Visit: Payer: Self-pay | Admitting: Physician Assistant

## 2016-05-15 ENCOUNTER — Telehealth: Payer: Self-pay | Admitting: Physician Assistant

## 2016-05-15 NOTE — Telephone Encounter (Signed)
I left a VM letting pt know she is due for a F/u appt with Tandy GawJade Breeback .Marland Kitchen. This would be to f/u on Depression and Anxiety

## 2016-06-24 ENCOUNTER — Other Ambulatory Visit: Payer: Self-pay | Admitting: Physician Assistant

## 2016-09-12 ENCOUNTER — Encounter: Payer: Self-pay | Admitting: *Deleted

## 2016-09-12 ENCOUNTER — Emergency Department
Admission: EM | Admit: 2016-09-12 | Discharge: 2016-09-12 | Disposition: A | Discharge status: Home / Self Care | Payer: 59 | Source: Home / Self Care | Attending: Emergency Medicine | Admitting: Emergency Medicine

## 2016-09-12 DIAGNOSIS — J0100 Acute maxillary sinusitis, unspecified: Secondary | ICD-10-CM

## 2016-09-12 DIAGNOSIS — J209 Acute bronchitis, unspecified: Secondary | ICD-10-CM

## 2016-09-12 MED ORDER — PROMETHAZINE-CODEINE 6.25-10 MG/5ML PO SYRP: ORAL_SOLUTION | ORAL | 0 refills | Status: DC

## 2016-09-12 MED ORDER — AZITHROMYCIN 250 MG PO TABS: ORAL_TABLET | ORAL | 0 refills | Status: DC

## 2016-09-12 NOTE — ED Triage Notes (Signed)
Pt c/o productive cough, SOB, runny nose and chills x 2 days. Denies fever.

## 2016-09-12 NOTE — ED Provider Notes (Signed)
[facility]    CSN: [id number] Arrival date & time: 09/12/16  1020     History   Chief Complaint Chief Complaint  Patient presents with  . Cough    HPI Shelby Cook is a 30 y.o. female.   HPI URI HISTORY  Shelby Cook is a 30 y.o. female who complains of onset of cold symptoms for 3days. Progressively worsening sinus and chest congestion, cough productive of yellow-green sputum. Have been using over-the-counter treatment which helps a little bit.  No chills/sweats +  Fever  +  Nasal congestion +  Discolored Post-nasal drainage Mild sinus pain/pressure No sore throat  +  cough No wheezing Positive chest congestion No hemoptysis No shortness of breath No pleuritic pain  No itchy/red eyes No earache  No nausea No vomiting No abdominal pain No diarrhea  No skin rashes +  Fatigue No myalgias No headache   Past Medical History:  Diagnosis Date  . Allergy     Patient Active Problem List   Diagnosis Date Noted  . Right knee pain 10/23/2015  . Abnormal weight gain 10/23/2015  . Morbid obesity (HCC) 10/23/2015  . Environmental allergies 08/18/2015  . Seasonal allergies 08/04/2015  . Anxiety and depression 08/04/2015  . Multiple allergies 08/04/2015  . HEADACHE 07/25/2010  . 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 OBESITY NOS 02/16/2007  . ALLERGIC RHINITIS 02/16/2007  . OLIGOMENORRHEA 02/16/2007  . ACNE NEC 02/16/2007    Past Surgical History:  Procedure Laterality Date  . WISDOM TOOTH EXTRACTION      OB History    No data available       Home Medications    Prior to Admission medications   Medication Sig Start Date End Date Taking? Authorizing Provider  loratadine (CLARITIN) 10 MG tablet Take 10 mg by mouth daily.   Yes Historical Provider, MD  montelukast (SINGULAIR) 10 MG tablet Take 1 tablet (10 mg total) by mouth at bedtime. 10/23/15  Yes Jade L Breeback, PA-C  azithromycin (ZITHROMAX Z-PAK)  250 MG tablet Take 2 tablets on day one, then 1 tablet daily on days 2 through 5 09/12/16   Lajean Manesavid Massey, MD  citalopram (CELEXA) 20 MG tablet Take 1 tablet (20 mg total) by mouth daily. Due for f/u appt 04/30/16   Jomarie LongsJade L Breeback, PA-C  EPINEPHrine 0.3 mg/0.3 mL IJ SOAJ injection Inject 0.3 mLs (0.3 mg total) into the muscle once. 03/18/16   Jade L Breeback, PA-C  hydrOXYzine (ATARAX/VISTARIL) 25 MG tablet Take 1 tablet (25 mg total) by mouth 3 (three) times daily as needed. 03/18/16   Jade L Breeback, PA-C  Multiple Vitamin (MULTIVITAMIN) tablet Take 1 tablet by mouth daily.      Historical Provider, MD  norethindrone-ethinyl estradiol (JUNEL FE 1/20) 1-20 MG-MCG tablet Take 1 tablet by mouth daily. 10/23/15   Jade L Breeback, PA-C  promethazine-codeine (PHENERGAN WITH CODEINE) 6.25-10 MG/5ML syrup Take 1-2 teaspoons every 4-6 hours as needed for cough. May cause drowsiness. 09/12/16   Lajean Manesavid Massey, MD    Family History Family History  Problem Relation Age of Onset  . Drug abuse Father   . Mental illness Father     bipolar  . Diabetes Father   . Hypertension Father   . Heart attack Father   . Osteoporosis Mother   . Scoliosis Sister   . Cancer Paternal Grandmother     breast and lung  . Diabetes Paternal Grandfather   . Heart attack Paternal Grandfather   .  Allergies Sister     Social History Social History  Substance Use Topics  . Smoking status: Former Smoker    Packs/day: 0.50    Types: Cigarettes    Quit date: 06/11/2015  . Smokeless tobacco: Never Used  . Alcohol use 0.0 oz/week     Allergies   Celexa [citalopram hydrobromide] and Flonase [fluticasone propionate]   Review of Systems Review of Systems  All other systems reviewed and are negative.    Physical Exam Triage Vital Signs ED Triage Vitals  Enc Vitals Group     BP 09/12/16 1057 112/70     Pulse Rate 09/12/16 1057 90     Resp 09/12/16 1057 18     Temp 09/12/16 1057 98.3 F (36.8 C)     Temp Source  09/12/16 1057 Oral     SpO2 09/12/16 1057 99 %     Weight 09/12/16 1057 259 lb (117.5 kg)     Height 09/12/16 1057 5\' 7"  (1.702 m)     Head Circumference --      Peak Flow --      Pain Score 09/12/16 1059 0     Pain Loc --      Pain Edu? --      Excl. in GC? --    No data found.   Updated Vital Signs BP 112/70 (BP Location: Left Arm)   Pulse 90   Temp 98.3 F (36.8 C) (Oral)   Resp 18   Ht 5\' 7"  (1.702 m)   Wt 259 lb (117.5 kg)   LMP 08/09/2016   SpO2 99%   BMI 40.57 kg/m   Visual Acuity Right Eye Distance:   Left Eye Distance:   Bilateral Distance:    Right Eye Near:   Left Eye Near:    Bilateral Near:     Physical Exam  Constitutional: She is oriented to person, place, and time. She appears well-developed and well-nourished. No distress.  HENT:  Head: Normocephalic and atraumatic.  Right Ear: Tympanic membrane, external ear and ear canal normal.  Left Ear: Tympanic membrane, external ear and ear canal normal.  Nose: Mucosal edema and rhinorrhea present. Right sinus exhibits maxillary sinus tenderness. Left sinus exhibits maxillary sinus tenderness.  Mouth/Throat: Oropharynx is clear and moist. No oral lesions. No oropharyngeal exudate.  Eyes: Right eye exhibits no discharge. Left eye exhibits no discharge. No scleral icterus.  Neck: Neck supple.  Cardiovascular: Normal rate, regular rhythm and normal heart sounds.   Pulmonary/Chest: Effort normal. She has no wheezes. She has rhonchi. She has no rales.  Lymphadenopathy:    She has no cervical adenopathy.  Neurological: She is alert and oriented to person, place, and time.  Skin: Skin is warm and dry.  Psychiatric: She has a normal mood and affect.  Nursing note and vitals reviewed.    UC Treatments / Results  Labs (all labs ordered are listed, but only abnormal results are displayed) Labs Reviewed - No data to display  EKG  EKG Interpretation None       Radiology No results  found.  Procedures Procedures (including critical care time)  Medications Ordered in UC Medications - No data to display   Initial Impression / Assessment and Plan / UC Course  I have reviewed the triage vital signs and the nursing notes.  Pertinent labs & imaging results that were available during my care of the patient were reviewed by me and considered in my medical decision making (see chart for details).  Clinical Course      Final Clinical Impressions(s) / UC Diagnoses   Final diagnoses:  Acute bronchitis, unspecified organism  Acute maxillary sinusitis, recurrence not specified   Treatment options discussed, as well as risks, benefits, alternatives. Patient voiced understanding and agreement with the following plans: New Prescriptions New Prescriptions   AZITHROMYCIN (ZITHROMAX Z-PAK) 250 MG TABLET    Take 2 tablets on day one, then 1 tablet daily on days 2 through 5   PROMETHAZINE-CODEINE (PHENERGAN WITH CODEINE) 6.25-10 MG/5ML SYRUP    Take 1-2 teaspoons every 4-6 hours as needed for cough. May cause drowsiness.  Flonase, Mucinex D OTC, and other symptomatic care discussed. Precautions discussed. Red flags discussed. Questions invited and answered. Patient voiced understanding and agreement.    Lajean Manes, MD 09/12/16 [phone]

## 2016-10-26 ENCOUNTER — Other Ambulatory Visit: Payer: Self-pay | Admitting: Physician Assistant

## 2016-10-30 ENCOUNTER — Other Ambulatory Visit: Payer: Self-pay | Admitting: *Deleted

## 2016-10-30 MED ORDER — NORETHIN ACE-ETH ESTRAD-FE 1-20 MG-MCG PO TABS: 1.0000 | ORAL_TABLET | Freq: Every day | ORAL | 11 refills | Status: DC

## 2016-11-01 ENCOUNTER — Encounter: Payer: Self-pay | Admitting: Physician Assistant

## 2016-11-01 ENCOUNTER — Ambulatory Visit (INDEPENDENT_AMBULATORY_CARE_PROVIDER_SITE_OTHER): Payer: 59 | Admitting: Physician Assistant

## 2016-11-01 ENCOUNTER — Other Ambulatory Visit: Payer: Self-pay | Admitting: Physician Assistant

## 2016-11-01 VITALS — BP 123/76 | HR 92 | Ht 67.0 in | Wt 257.0 lb

## 2016-11-01 DIAGNOSIS — F329 Major depressive disorder, single episode, unspecified: Secondary | ICD-10-CM

## 2016-11-01 DIAGNOSIS — F32A Depression, unspecified: Secondary | ICD-10-CM

## 2016-11-01 DIAGNOSIS — F419 Anxiety disorder, unspecified: Principal | ICD-10-CM

## 2016-11-01 DIAGNOSIS — F418 Other specified anxiety disorders: Secondary | ICD-10-CM | POA: Diagnosis not present

## 2016-11-01 DIAGNOSIS — R635 Abnormal weight gain: Secondary | ICD-10-CM

## 2016-11-01 MED ORDER — BUPROPION HCL ER (XL) 150 MG PO TB24: 150.0000 mg | ORAL_TABLET | Freq: Every day | ORAL | 1 refills | Status: DC

## 2016-11-01 MED ORDER — LIRAGLUTIDE -WEIGHT MANAGEMENT 18 MG/3ML ~~LOC~~ SOPN: 0.6000 mg | PEN_INJECTOR | Freq: Every day | SUBCUTANEOUS | 1 refills | Status: DC

## 2016-11-01 NOTE — Progress Notes (Addendum)
Subjective:     Patient ID: Shelby Cook, female   DOB: 12-14-1985, 31 y.o.   MRN: [id number]  HPI  This is a 31 year old female who presents for treatment of depression. She states she was previously started on Celexa but had an allergic reaction (hives) and was instructed to discontinue use. States she has difficulty sleeping but takes Benadryl qhs for allergies which helps. Denies SI/HI but states she occasionally has thoughts that "everyone else would be better off if I weren't around" but has no plan. Endorses the following depressive symptoms: anhedonia, feelings of depression, crying spells, lack of energy, feelings of worthlessness, and difficulty concentrating. States she has a family history of Major Depressive Disorder and that her mother is currently well managed on Wellbutrin and "some other medication."  Also endorses the following anxiety symptoms: feeling nervous and anxious, constant worrying which she cannot control, difficulty relaxing, irritability, and unreasonable fear concerning future events. She states most of her anxiety is centered around work as it is very demanding and is currently looking for a new job to avoid stressors.   Denies any symptoms of mania or psychosis.  Also requests medication to help with weight loss. States she has been trying for greater than 1 year to lose weight however has been unsuccessful despite increased exercise and diet modification.  Review of Systems All other ROS negative except those noted in the HPI.     Objective:   Physical Exam  Constitutional: She is oriented to person, place, and time. She appears well-developed and well-nourished. No distress.  HENT:  Head: Normocephalic and atraumatic.  Cardiovascular: Normal rate and regular rhythm.  Exam reveals no gallop and no friction rub.   No murmur heard. Pulmonary/Chest: Effort normal and breath sounds normal. No respiratory distress. She has no wheezes. She has no rales.   Musculoskeletal: Normal range of motion.  Neurological: She is alert and oriented to person, place, and time.  Skin: Skin is warm and dry.  Psychiatric: She has a normal mood and affect. Her behavior is normal. Judgment and thought content normal.   Depression screen PHQ 2/9 11/01/2016  Decreased Interest 2  Down, Depressed, Hopeless 2  PHQ - 2 Score 4  Altered sleeping 2  Tired, decreased energy 1  Change in appetite 2  Feeling bad or failure about yourself  2  Trouble concentrating 2  Moving slowly or fidgety/restless 0  Suicidal thoughts 1  PHQ-9 Score 14   GAD 7 : Generalized Anxiety Score 11/01/2016  Nervous, Anxious, on Edge 3  Control/stop worrying 2  Worry too much - different things 2  Trouble relaxing 2  Restless 2  Easily annoyed or irritable 3  Afraid - awful might happen 2  Total GAD 7 Score 16       Assessment/Plan:  Shelby Cook was seen today for depression.  Diagnoses and all orders for this visit:  Morbid obesity (HCC) -     Liraglutide -Weight Management (SAXENDA) 18 MG/3ML SOPN; Inject 0.6 mg into the skin daily. For one week then increase by .  weekly until reaches  daily.  Please include ultra fine needles 6mm  Anxiety and depression -     buPROPion (WELLBUTRIN XL) 150 MG 24 hr tablet; Take 1 tablet (150 mg total) by mouth daily.  Abnormal weight gain -     Liraglutide -Weight Management (SAXENDA) 18 MG/3ML SOPN; Inject 0.6 mg into the skin daily. For one week then increase by .  weekly until reaches  daily.  Please include ultra fine needles 6mm  Patient started on Wellbutrin given family history of responding well to this medication and prominent feelings of fatigue, difficulty concentrating, and decreased motivation. Instructed to stop taking Wellbutrin if she experiences worsening depression or allergic reaction as she did with Zoloft. If hives occur, take 50 mg Benadryl and if facial, lip, or tongue swelling or difficulty breathing occurs  seek help immediately. Will consider adding SSRI to augment Wellbutrin if symptoms have not improved in 4-6 weeks.  Also prescribed Saxenda given inability to lose weight x 1 year despite exercise and diet changes. Informed patient to increase by 0.6 mg each week, however if she experiences significant nausea at a certain dose she can remain at this dose for 2 weeks before increasing. Discussed Phentermine, however this is not a good option given recent increase in anxiety and insomnia. Believe weight loss will also help with mood improvement.

## 2016-11-29 ENCOUNTER — Ambulatory Visit: Payer: 59 | Admitting: Physician Assistant

## 2016-12-28 ENCOUNTER — Other Ambulatory Visit: Payer: Self-pay | Admitting: Physician Assistant

## 2016-12-28 DIAGNOSIS — F419 Anxiety disorder, unspecified: Principal | ICD-10-CM

## 2016-12-28 DIAGNOSIS — F329 Major depressive disorder, single episode, unspecified: Secondary | ICD-10-CM

## 2017-01-09 ENCOUNTER — Other Ambulatory Visit: Payer: Self-pay | Admitting: Physician Assistant

## 2017-01-09 DIAGNOSIS — F419 Anxiety disorder, unspecified: Principal | ICD-10-CM

## 2017-01-09 DIAGNOSIS — F32A Depression, unspecified: Secondary | ICD-10-CM

## 2017-01-09 DIAGNOSIS — F329 Major depressive disorder, single episode, unspecified: Secondary | ICD-10-CM

## 2017-02-06 ENCOUNTER — Other Ambulatory Visit: Payer: Self-pay | Admitting: Physician Assistant

## 2017-02-06 DIAGNOSIS — F329 Major depressive disorder, single episode, unspecified: Secondary | ICD-10-CM

## 2017-02-06 DIAGNOSIS — F419 Anxiety disorder, unspecified: Principal | ICD-10-CM

## 2017-03-01 ENCOUNTER — Other Ambulatory Visit: Payer: Self-pay | Admitting: Physician Assistant

## 2017-03-01 DIAGNOSIS — F329 Major depressive disorder, single episode, unspecified: Secondary | ICD-10-CM

## 2017-03-01 DIAGNOSIS — F419 Anxiety disorder, unspecified: Principal | ICD-10-CM

## 2017-03-16 ENCOUNTER — Emergency Department
Admission: EM | Admit: 2017-03-16 | Discharge: 2017-03-16 | Disposition: A | Discharge status: Home / Self Care | Payer: 59 | Source: Home / Self Care | Attending: Family Medicine | Admitting: Family Medicine

## 2017-03-16 ENCOUNTER — Encounter: Payer: Self-pay | Admitting: Emergency Medicine

## 2017-03-16 DIAGNOSIS — J029 Acute pharyngitis, unspecified: Secondary | ICD-10-CM

## 2017-03-16 DIAGNOSIS — F419 Anxiety disorder, unspecified: Secondary | ICD-10-CM

## 2017-03-16 DIAGNOSIS — J019 Acute sinusitis, unspecified: Secondary | ICD-10-CM

## 2017-03-16 DIAGNOSIS — R635 Abnormal weight gain: Secondary | ICD-10-CM

## 2017-03-16 DIAGNOSIS — F329 Major depressive disorder, single episode, unspecified: Secondary | ICD-10-CM

## 2017-03-16 DIAGNOSIS — F32A Depression, unspecified: Secondary | ICD-10-CM

## 2017-03-16 DIAGNOSIS — J069 Acute upper respiratory infection, unspecified: Secondary | ICD-10-CM

## 2017-03-16 MED ORDER — BENZOCAINE-MENTHOL 15-10 MG MT LOZG: 1.0000 | LOZENGE | Freq: Four times a day (QID) | OROMUCOSAL | 0 refills | Status: DC | PRN

## 2017-03-16 MED ORDER — AZITHROMYCIN 250 MG PO TABS
250.0000 mg | ORAL_TABLET | Freq: Every day | ORAL | 0 refills | Status: DC
Start: 2017-03-16 — End: 2017-08-16

## 2017-03-16 MED ORDER — LIRAGLUTIDE -WEIGHT MANAGEMENT 18 MG/3ML ~~LOC~~ SOPN: 0.6000 mg | PEN_INJECTOR | Freq: Every day | SUBCUTANEOUS | 1 refills | Status: DC

## 2017-03-16 MED ORDER — BUPROPION HCL ER (XL) 150 MG PO TB24: 150.0000 mg | ORAL_TABLET | Freq: Every day | ORAL | 0 refills | Status: DC

## 2017-03-16 MED ORDER — AZITHROMYCIN 250 MG PO TABS: 250.0000 mg | ORAL_TABLET | Freq: Every day | ORAL | 0 refills | Status: DC

## 2017-03-16 NOTE — ED Provider Notes (Signed)
CSN: [id number]     Arrival date & time 03/16/17  1112 History   First MD Initiated Contact with Patient 03/16/17 1136     Chief Complaint  Patient presents with  . Nasal Congestion  . URI  . Sore Throat  . Otalgia   (Consider location/radiation/quality/duration/timing/severity/associated sxs/prior Treatment) HPI  Jin Shockley is a 31 y.o. female presenting to UC with c/o 4 days of worsening URI symptoms including cough, congestion, sore throat and bilateral ear pain. Throat pain is most bothersome.  Cough is non-productive. Fever Tmax 101*F the first two days. No known sick contacts. Denies n/v/d. She has taken ibuprofen and Claritin D with mild temporary relief.    Past Medical History:  Diagnosis Date  . Allergy    Past Surgical History:  Procedure Laterality Date  . WISDOM TOOTH EXTRACTION     Family History  Problem Relation Age of Onset  . Drug abuse Father   . Mental illness Father        bipolar  . Diabetes Father   . Hypertension Father   . Heart attack Father   . Osteoporosis Mother   . Scoliosis Sister   . Cancer Paternal Grandmother        breast and lung  . Diabetes Paternal Grandfather   . Heart attack Paternal Grandfather   . Allergies Sister    Social History  Substance Use Topics  . Smoking status: Former Smoker    Packs/day: 0.50    Types: Cigarettes    Quit date: 06/11/2015  . Smokeless tobacco: Never Used  . Alcohol use 0.0 oz/week   OB History    No data available     Review of Systems  Constitutional: Positive for fatigue and fever. Negative for chills.  HENT: Positive for congestion, ear pain, postnasal drip, rhinorrhea, sinus pain, sinus pressure and sore throat. Negative for trouble swallowing and voice change.   Respiratory: Positive for cough. Negative for shortness of breath.   Cardiovascular: Negative for chest pain and palpitations.  Gastrointestinal: Negative for abdominal pain, diarrhea, nausea and vomiting.   Musculoskeletal: Negative for arthralgias, back pain and myalgias.  Skin: Negative for rash.  Neurological: Positive for headaches. Negative for dizziness and light-headedness.    Allergies  Celexa [citalopram hydrobromide] and Flonase [fluticasone propionate]  Home Medications   Prior to Admission medications   Medication Sig Start Date End Date Taking? Authorizing Provider  azithromycin (ZITHROMAX) 250 MG tablet Take 1 tablet (250 mg total) by mouth daily. Take first 2 tablets together, then 1 every day until finished. 03/16/17   Erikka Follmer O, PA-C  Benzocaine-Menthol 15-10 MG LOZG Use as directed 1 lozenge in the mouth or throat 4 (four) times daily as needed. 03/16/17   Lurene Shadow, PA-C  EPINEPHrine 0.3 mg/0.3 mL IJ SOAJ injection Inject 0.3 mLs (0.3 mg total) into the muscle once. 03/18/16   Breeback, Jade L, PA-C  loratadine (CLARITIN) 10 MG tablet Take 10 mg by mouth daily.    [provider]  montelukast (SINGULAIR) 10 MG tablet TAKE 1 TABLET (10 MG TOTAL) BY MOUTH AT BEDTIME. 11/01/16   Breeback, Jade L, PA-C  Multiple Vitamin (MULTIVITAMIN) tablet Take 1 tablet by mouth daily.      [provider]  norethindrone-ethinyl estradiol (JUNEL FE 1/20) 1-20 MG-MCG tablet Take 1 tablet by mouth daily. 10/30/16   Jomarie Longs, PA-C   Meds Ordered and Administered this Visit  Medications - No data to display  BP 123/84 (BP  Location: Left Arm)   Pulse 99   Temp 98.4 F (36.9 C) (Oral)   Resp 16   Ht 5\' 7"  (1.702 m)   Wt 260 lb (117.9 kg)   LMP 03/07/2017   SpO2 98%   BMI 40.72 kg/m  No data found.   Physical Exam  Constitutional: She is oriented to person, place, and time. She appears well-developed and well-nourished. No distress.  HENT:  Head: Normocephalic and atraumatic.  Right Ear: Tympanic membrane normal.  Left Ear: Tympanic membrane normal.  Nose: Mucosal edema present. Right sinus exhibits maxillary sinus tenderness and frontal sinus  tenderness. Left sinus exhibits maxillary sinus tenderness and frontal sinus tenderness.  Mouth/Throat: Uvula is midline and mucous membranes are normal. Posterior oropharyngeal erythema present. No oropharyngeal exudate, posterior oropharyngeal edema or tonsillar abscesses.  Eyes: EOM are normal.  Neck: Normal range of motion. Neck supple.  Cardiovascular: Normal rate and regular rhythm.   Pulmonary/Chest: Effort normal and breath sounds normal. No stridor. No respiratory distress. She has no wheezes. She has no rales.  Musculoskeletal: Normal range of motion.  Lymphadenopathy:    She has no cervical adenopathy.  Neurological: She is alert and oriented to person, place, and time.  Skin: Skin is warm and dry. She is not diaphoretic.  Psychiatric: She has a normal mood and affect. Her behavior is normal.  Nursing note and vitals reviewed.   Urgent Care Course     Procedures (including critical care time)  Labs Review Labs Reviewed - No data to display  Imaging Review No results found.    MDM   1. Acute rhinosinusitis   2. Acute pharyngitis, unspecified etiology   3. Upper respiratory tract infection, unspecified type    Pt c/o 4 days of worsening URI symptoms.  Sinus tenderness noted on exam.  Rx: Azithromycin and benzocaine-menthol throat lozenges Encouraged good hydration F/u with PCP in 1 week if not improving.     Lurene Shadowhelps, Lewanda Perea O, [place]-C 03/16/17 1302

## 2017-03-16 NOTE — ED Triage Notes (Signed)
[facility]. Fever the first two days. Cough non productive

## 2017-03-24 ENCOUNTER — Encounter: Payer: Self-pay | Admitting: *Deleted

## 2017-03-24 ENCOUNTER — Emergency Department
Admission: EM | Admit: 2017-03-24 | Discharge: 2017-03-24 | Disposition: A | Discharge status: Home / Self Care | Payer: 59 | Source: Home / Self Care | Attending: Family Medicine | Admitting: Family Medicine

## 2017-03-24 DIAGNOSIS — H6691 Otitis media, unspecified, right ear: Secondary | ICD-10-CM | POA: Diagnosis not present

## 2017-03-24 MED ORDER — PREDNISONE 20 MG PO TABS: ORAL_TABLET | ORAL | 0 refills | Status: DC

## 2017-03-24 MED ORDER — AMOXICILLIN-POT CLAVULANATE 875-125 MG PO TABS: 1.0000 | ORAL_TABLET | Freq: Two times a day (BID) | ORAL | 0 refills | Status: DC

## 2017-03-24 NOTE — ED Provider Notes (Signed)
CSN: [id number]     Arrival date & time 03/24/17  1008 History   First MD Initiated Contact with Patient 03/24/17 1025     Chief Complaint  Patient presents with  . Nasal Congestion   (Consider location/radiation/quality/duration/timing/severity/associated sxs/prior Treatment) HPI Shelby Cook is a 31 y.o. female presenting to UC with c/o continued green nasal discharge along with Right ear fullness/decreased hearing.  She was seen at [facility] for URI and sinus infection on 03/16/17, tx with azithromycin, which she has completed. Her other symptoms of cough and sore throat have resolved. Denies n/v/d. Denies fever or chills.    Past Medical History:  Diagnosis Date  . Allergy    Past Surgical History:  Procedure Laterality Date  . WISDOM TOOTH EXTRACTION     Family History  Problem Relation Age of Onset  . Drug abuse Father   . Mental illness Father        bipolar  . Diabetes Father   . Hypertension Father   . Heart attack Father   . Osteoporosis Mother   . Scoliosis Sister   . Cancer Paternal Grandmother        breast and lung  . Diabetes Paternal Grandfather   . Heart attack Paternal Grandfather   . Allergies Sister    Social History  Substance Use Topics  . Smoking status: Former Smoker    Packs/day: 0.50    Types: Cigarettes    Quit date: 06/11/2015  . Smokeless tobacco: Never Used  . Alcohol use 0.0 oz/week   OB History    No data available     Review of Systems  Constitutional: Negative for chills and fever.  HENT: Positive for congestion, ear pain (Right fullness), hearing loss (Right) and sinus pressure. Negative for sore throat, trouble swallowing and voice change.   Respiratory: Negative for cough and shortness of breath.   Cardiovascular: Negative for chest pain and palpitations.  Gastrointestinal: Negative for abdominal pain, diarrhea, nausea and vomiting.  Musculoskeletal: Negative for arthralgias, back pain and myalgias.  Skin: Negative for rash.   Neurological: Negative for dizziness, light-headedness and headaches.    Allergies  Celexa [citalopram hydrobromide] and Flonase [fluticasone propionate]  Home Medications   Prior to Admission medications   Medication Sig Start Date End Date Taking? Authorizing Provider  amoxicillin-clavulanate (AUGMENTIN) 875-125 MG tablet Take 1 tablet by mouth 2 (two) times daily. One po bid x 7 days 03/24/17   Lurene Shadow, PA-C  azithromycin (ZITHROMAX) 250 MG tablet Take 1 tablet (250 mg total) by mouth daily. Take first 2 tablets together, then 1 every day until finished. 03/16/17   Keiden Deskin O, PA-C  Benzocaine-Menthol 15-10 MG LOZG Use as directed 1 lozenge in the mouth or throat 4 (four) times daily as needed. 03/16/17   Lurene Shadow, PA-C  EPINEPHrine 0.3 mg/0.3 mL IJ SOAJ injection Inject 0.3 mLs (0.3 mg total) into the muscle once. 03/18/16   Breeback, Jade L, PA-C  loratadine (CLARITIN) 10 MG tablet Take 10 mg by mouth daily.    [provider]  montelukast (SINGULAIR) 10 MG tablet TAKE 1 TABLET (10 MG TOTAL) BY MOUTH AT BEDTIME. 11/01/16   Breeback, Jade L, PA-C  Multiple Vitamin (MULTIVITAMIN) tablet Take 1 tablet by mouth daily.      [provider]  norethindrone-ethinyl estradiol (JUNEL FE 1/20) 1-20 MG-MCG tablet Take 1 tablet by mouth daily. 10/30/16   Breeback, Jade L, PA-C  predniSONE (DELTASONE) 20 MG tablet 3 tabs po day  one, then 2 po daily x 4 days 03/24/17   Lurene ShadowPhelps, Monseratt Ledin O, PA-C   Meds Ordered and Administered this Visit  Medications - No data to display  BP 108/73 (BP Location: Left Arm)   Pulse 92   Temp 98.4 F (36.9 C) (Oral)   Resp 18   Ht 5\' 7"  (1.702 m)   Wt 259 lb (117.5 kg)   LMP 03/07/2017   SpO2 98%   BMI 40.57 kg/m  No data found.   Physical Exam  Constitutional: She appears well-developed and well-nourished. No distress.  HENT:  Head: Normocephalic and atraumatic.  Right Ear: Tympanic membrane is erythematous. Tympanic membrane is not  bulging. A middle ear effusion is present.  Left Ear: Tympanic membrane normal.  Nose: Nose normal. Right sinus exhibits no maxillary sinus tenderness and no frontal sinus tenderness. Left sinus exhibits no maxillary sinus tenderness and no frontal sinus tenderness.  Mouth/Throat: Uvula is midline, oropharynx is clear and moist and mucous membranes are normal.  Eyes: Conjunctivae are normal. No scleral icterus.  Neck: Normal range of motion. Neck supple.  Cardiovascular: Normal rate, regular rhythm and normal heart sounds.   Pulmonary/Chest: Effort normal and breath sounds normal. No stridor. No respiratory distress. She has no wheezes. She has no rales.  Musculoskeletal: Normal range of motion.  Lymphadenopathy:    She has no cervical adenopathy.  Neurological: She is alert.  Skin: Skin is warm and dry. She is not diaphoretic.  Nursing note and vitals reviewed.   Urgent Care Course     Procedures (including critical care time)  Labs Review Labs Reviewed - No data to display  Imaging Review No results found.   MDM   1. Right acute otitis media    Exam c/w Right AOM  Rx: Prednisone and Augmentin Home care instructions provided. F/u with PCP in 1 week if not improving.    Lurene Shadowhelps, [place] Katich O, [place]-C 03/24/17 1036

## 2017-03-24 NOTE — ED Triage Notes (Signed)
Pt c/o still having green nasal d/c since her visit last week and now she has decreased hearing on the RT side.

## 2017-03-30 ENCOUNTER — Encounter: Payer: Self-pay | Admitting: Physician Assistant

## 2017-08-16 ENCOUNTER — Other Ambulatory Visit: Payer: Self-pay

## 2017-08-16 ENCOUNTER — Encounter: Payer: Self-pay | Admitting: *Deleted

## 2017-08-16 ENCOUNTER — Emergency Department
Admission: EM | Admit: 2017-08-16 | Discharge: 2017-08-16 | Disposition: A | Discharge status: Home / Self Care | Payer: 59 | Source: Home / Self Care | Attending: Family Medicine | Admitting: Family Medicine

## 2017-08-16 DIAGNOSIS — H6593 Unspecified nonsuppurative otitis media, bilateral: Secondary | ICD-10-CM | POA: Diagnosis not present

## 2017-08-16 DIAGNOSIS — R42 Dizziness and giddiness: Secondary | ICD-10-CM

## 2017-08-16 MED ORDER — PREDNISONE 20 MG PO TABS: ORAL_TABLET | ORAL | 0 refills | Status: DC

## 2017-08-16 MED ORDER — AMOXICILLIN-POT CLAVULANATE 875-125 MG PO TABS: 1.0000 | ORAL_TABLET | Freq: Two times a day (BID) | ORAL | 0 refills | Status: DC

## 2017-08-16 NOTE — ED Triage Notes (Signed)
Patient c/o 1 week of intermittent right ear pain. Yesterday developed sudden onset dizziness and lightheaded and lost her balance. Denies loss of consciousness. "Someone was talking to me, I was looking at them but could not hear them". Dizziness is not positional and is intermittent.

## 2017-08-16 NOTE — Discharge Instructions (Signed)
°  You may take 500mg acetaminophen every 4-6 hours or in combination with ibuprofen 400-600mg every 6-8 hours as needed for pain, inflammation, and fever. ° °Be sure to drink at least eight 8oz glasses of water to stay well hydrated and get at least 8 hours of sleep at night, preferably more while sick.  ° °Please take antibiotics as prescribed and be sure to complete entire course even if you start to feel better to ensure infection does not come back. ° °

## 2017-08-16 NOTE — ED Provider Notes (Signed)
[facility]    CSN: [id number] Arrival date & time: 08/16/17  0911     History   Chief Complaint Chief Complaint  Patient presents with  . Otalgia  . Dizziness    HPI Shelby CoveDanielle Saraceni is a 31 y.o. female.   HPI Shelby Cook is a 31 y.o. female presenting to UC with c/o 1 week of intermittent Right ear pain that is sharp and stabbing at times.  She developed sudden onset of dizziness and lightheadedness that caused her to lose her balance yesterday.  Denies HA, nausea or LOC.  Pt states "someone was talking to me, I was looking at them but could not hear them.  Dizziness is not positional and is intermittent. Denies dizziness at this time. She has had minimal congestion as it resolves with the Claritin she takes daily. She has also taken ibuprofen for the ear pain that works temporarily.  Denies fever, chills, n/v/d.    Past Medical History:  Diagnosis Date  . Allergy     Patient Active Problem List   Diagnosis Date Noted  . Right knee pain 10/23/2015  . Abnormal weight gain 10/23/2015  . Morbid obesity (HCC) 10/23/2015  . Environmental allergies 08/18/2015  . Seasonal allergies 08/04/2015  . Anxiety and depression 08/04/2015  . Multiple allergies 08/04/2015  . HEADACHE 07/25/2010  . 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 OBESITY NOS 02/16/2007  . ALLERGIC RHINITIS 02/16/2007  . OLIGOMENORRHEA 02/16/2007  . ACNE NEC 02/16/2007    Past Surgical History:  Procedure Laterality Date  . WISDOM TOOTH EXTRACTION      OB History    No data available       Home Medications    Prior to Admission medications   Medication Sig Start Date End Date Taking? Authorizing Provider  loratadine (CLARITIN) 10 MG tablet Take 10 mg by mouth daily.   Yes [provider]  montelukast (SINGULAIR) 10 MG tablet TAKE 1 TABLET (10 MG TOTAL) BY MOUTH AT BEDTIME. 11/01/16  Yes Breeback, Jade L, PA-C  Multiple Vitamin (MULTIVITAMIN)  tablet Take 1 tablet by mouth daily.     Yes [provider]  norethindrone-ethinyl estradiol (JUNEL FE 1/20) 1-20 MG-MCG tablet Take 1 tablet by mouth daily. 10/30/16  Yes Breeback, Jade L, PA-C  amoxicillin-clavulanate (AUGMENTIN) 875-125 MG tablet Take 1 tablet by mouth 2 (two) times daily. One po bid x 7 days 08/16/17   Lurene ShadowPhelps, Llewelyn Sheaffer O, PA-C  EPINEPHrine 0.3 mg/0.3 mL IJ SOAJ injection Inject 0.3 mLs (0.3 mg total) into the muscle once. 03/18/16   Jomarie LongsBreeback, Jade L, PA-C  predniSONE (DELTASONE) 20 MG tablet 3 tabs po day one, then 2 po daily x 4 days 08/16/17   Lurene ShadowPhelps, Arne Schlender O, PA-C    Family History Family History  Problem Relation Age of Onset  . Drug abuse Father   . Mental illness Father        bipolar  . Diabetes Father   . Hypertension Father   . Heart attack Father   . Osteoporosis Mother   . Scoliosis Sister   . Cancer Paternal Grandmother        breast and lung  . Diabetes Paternal Grandfather   . Heart attack Paternal Grandfather   . Allergies Sister     Social History Social History   Tobacco Use  . Smoking status: Former Smoker    Packs/day: 0.50    Types: Cigarettes    Last attempt to quit: 06/11/2015  Years since quitting: 2.1  . Smokeless tobacco: Never Used  Substance Use Topics  . Alcohol use: Yes    Alcohol/week: 0.0 oz  . Drug use: No     Allergies   Celexa [citalopram hydrobromide] and Flonase [fluticasone propionate]   Review of Systems Review of Systems  Constitutional: Negative for chills and fever.  HENT: Positive for congestion ( minimal) and ear pain (Right > Left). Negative for sore throat, trouble swallowing and voice change.   Respiratory: Negative for cough and shortness of breath.   Cardiovascular: Negative for chest pain and palpitations.  Gastrointestinal: Negative for abdominal pain, diarrhea, nausea and vomiting.  Musculoskeletal: Negative for arthralgias, back pain and myalgias.  Skin: Negative for rash.  Neurological:  Positive for dizziness and light-headedness. Negative for tremors, seizures, syncope, facial asymmetry, speech difficulty, weakness, numbness and headaches.     Physical Exam Triage Vital Signs ED Triage Vitals [08/16/17 0931]  Enc Vitals Group     BP 125/81     Pulse Rate 78     Resp      Temp 98.4 F (36.9 C)     Temp Source Oral     SpO2 100 %     Weight 253 lb (114.8 kg)     Height      Head Circumference      Peak Flow      Pain Score 0     Pain Loc      Pain Edu?      Excl. in GC?    No data found.  Updated Vital Signs BP 125/81 (BP Location: Left Arm)   Pulse 78   Temp 98.4 F (36.9 C) (Oral)   Wt 253 lb (114.8 kg)   LMP 08/13/2017   SpO2 100%   BMI 39.63 kg/m   Visual Acuity Right Eye Distance:   Left Eye Distance:   Bilateral Distance:    Right Eye Near:   Left Eye Near:    Bilateral Near:     Physical Exam  Constitutional: She is oriented to person, place, and time. She appears well-developed and well-nourished. No distress.  HENT:  Head: Normocephalic and atraumatic.  Right Ear: Tympanic membrane is not erythematous and not bulging. A middle ear effusion is present.  Left Ear: Tympanic membrane is not erythematous and not bulging. A middle ear effusion is present.  Nose: Nose normal. Right sinus exhibits no maxillary sinus tenderness and no frontal sinus tenderness. Left sinus exhibits no maxillary sinus tenderness and no frontal sinus tenderness.  Mouth/Throat: Uvula is midline, oropharynx is clear and moist and mucous membranes are normal.  Eyes: Conjunctivae and EOM are normal. Pupils are equal, round, and reactive to light.  Neck: Normal range of motion. Neck supple.  Cardiovascular: Normal rate and regular rhythm.  Pulmonary/Chest: Effort normal and breath sounds normal. No stridor. No respiratory distress. She has no wheezes. She has no rales.  Musculoskeletal: Normal range of motion.  Neurological: She is alert and oriented to person,  place, and time. No cranial nerve deficit.  CN II-XII grossly in tact. Speech is clear. Alert to person, place and time. Normal finger to nose coordination. Normal gait.   Skin: Skin is warm and dry. She is not diaphoretic.  Psychiatric: She has a normal mood and affect. Her behavior is normal.  Nursing note and vitals reviewed.    UC Treatments / Results  Labs (all labs ordered are listed, but only abnormal results are displayed) Labs Reviewed - No  data to display  EKG  EKG Interpretation None       Radiology No results found.  Procedures Procedures (including critical care time)  Medications Ordered in UC Medications - No data to display   Initial Impression / Assessment and Plan / UC Course  I have reviewed the triage vital signs and the nursing notes.  Pertinent labs & imaging results that were available during my care of the patient were reviewed by me and considered in my medical decision making (see chart for details).     Tympanometry: Positive Tympanometric Peak Pressure in BOTH ears  Likely causing pt's reported symptoms.  Will tx for underlying AOM Home care instructions provided F/u with PCP in 1 week if not improving, sooner if worsening.  Final Clinical Impressions(s) / UC Diagnoses   Final diagnoses:  Bilateral otitis media with effusion  Dizziness    ED Discharge Orders        Ordered    amoxicillin-clavulanate (AUGMENTIN) 875-125 MG tablet  2 times daily     08/16/17 0951    predniSONE (DELTASONE) 20 MG tablet     08/16/17 0951       Controlled Substance Prescriptions [place] Controlled Substance Registry consulted? Not Applicable   Rolla Platehelps, Genna Casimir O, PA-C 08/16/17 [id number]

## 2017-11-12 ENCOUNTER — Encounter: Payer: Self-pay | Admitting: Physician Assistant

## 2017-11-12 ENCOUNTER — Ambulatory Visit: Payer: 59 | Admitting: Physician Assistant

## 2017-11-12 VITALS — BP 137/81 | HR 105 | Ht 67.0 in | Wt 257.0 lb

## 2017-11-12 DIAGNOSIS — Z3201 Encounter for pregnancy test, result positive: Secondary | ICD-10-CM | POA: Diagnosis not present

## 2017-11-12 LAB — HCG, QUANTITATIVE, PREGNANCY: HCG, Total, QN: 163 m[IU]/mL

## 2017-11-12 NOTE — Progress Notes (Signed)
   Subjective:    Patient ID: Shelby CoveDanielle Zambito, female    DOB: 1986/08/05, 32 y.o.   MRN: [id number]  HPI  Patient is a 32 year old female who presents to the clinic to follow-up on positive home pregnancy test.  She is taking her Janel birth control daily without any missed doses.  She realized that she had not had a menstrual cycle earlier this week since January.  It is not uncommon for her to not be exactly regular but she usually has them monthly.  She also noticed increasing breast tenderness.  She took 2 home pregnancy test both which were positive.  She has never been pregnant and it was not her goal to become pregnant.  She denies any morning sickness or vaginal bleeding.  .. Active Ambulatory Problems    Diagnosis Date Noted  . OBESITY NOS 02/16/2007  . ALLERGIC RHINITIS 02/16/2007  . OLIGOMENORRHEA 02/16/2007  . 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 HEADACHE 07/25/2010  . 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 Seasonal allergies 08/04/2015  . Anxiety and depression 08/04/2015  . Multiple allergies 08/04/2015  . Environmental allergies 08/18/2015  . Right knee pain 10/23/2015  . Abnormal weight gain 10/23/2015  . Morbid obesity (HCC) 10/23/2015   Resolved Ambulatory Problems    Diagnosis Date Noted  . No Resolved Ambulatory Problems   Past Medical History:  Diagnosis Date  . Allergy        Review of Systems  All other systems reviewed and are negative.      Objective:   Physical Exam  Constitutional: She is oriented to person, place, and time. She appears well-developed and well-nourished.  HENT:  Head: Normocephalic and atraumatic.  Cardiovascular: Normal rate, regular rhythm and normal heart sounds.  Pulmonary/Chest: Effort normal and breath sounds normal.  Neurological: She is alert and oriented to person, place, and time.  Psychiatric: She has a normal mood and affect. Her behavior is normal.          Assessment & Plan:  Marland Kitchen.Marland Kitchen.Diagnoses  and all orders for this visit:  Positive pregnancy test -     B-HCG Quant     We will confirm pregnancy with serum hCG.  Discussed that likely she is pregnant due to very small amount of false positive home hCG.  Stop birth control at this time.  She can continue her Claritin and Singulair as needed.  Encouraged her to start a multivitamin with DHA and folic acid.  Gave her handout of medications to avoid while pregnant.  Discussed most common pregnancy symptoms.  She will need to schedule an appointment with OB at 8 weeks.  I estimate her current pregnancy is about at 6 weeks.  This would put her due date to July 08, 2018.  Once we confirm pregnancy I can certainly make referral to next door or she could she is her own OB.

## 2017-11-12 NOTE — Progress Notes (Signed)
Call pt: Pt is pregnant but much earlier than excpected she is 1-2 weeks. She will need OB appt at 8 weeks.

## 2017-11-12 NOTE — Patient Instructions (Addendum)
Prenatal with folic acid and DHA.    First Trimester of Pregnancy The first trimester of pregnancy is from week 1 until the end of week 13 (months 1 through 3). During this time, your baby will begin to develop inside you. At 6-8 weeks, the eyes and face are formed, and the heartbeat can be seen on ultrasound. At the end of 12 weeks, all the baby's organs are formed. Prenatal care is all the medical care you receive before the birth of your baby. Make sure you get good prenatal care and follow all of your doctor's instructions. Follow these instructions at home: Medicines  Take over-the-counter and prescription medicines only as told by your doctor. Some medicines are safe and some medicines are not safe during pregnancy.  Take a prenatal vitamin that contains at least 600 micrograms (mcg) of folic acid.  If you have trouble pooping (constipation), take medicine that will make your stool soft (stool softener) if your doctor approves. Eating and drinking  Eat regular, healthy meals.  Your doctor will tell you the amount of weight gain that is right for you.  Avoid raw meat and uncooked cheese.  If you feel sick to your stomach (nauseous) or throw up (vomit): ? Eat 4 or 5 small meals a day instead of 3 large meals. ? Try eating a few soda crackers. ? Drink liquids between meals instead of during meals.  To prevent constipation: ? Eat foods that are high in fiber, like fresh fruits and vegetables, whole grains, and beans. ? Drink enough fluids to keep your pee (urine) clear or pale yellow. Activity  Exercise only as told by your doctor. Stop exercising if you have cramps or pain in your lower belly (abdomen) or low back.  Do not exercise if it is too hot, too humid, or if you are in a place of great height (high altitude).  Try to avoid standing for long periods of time. Move your legs often if you must stand in one place for a long time.  Avoid heavy lifting.  Wear low-heeled  shoes. Sit and stand up straight.  You can have sex unless your doctor tells you not to. Relieving pain and discomfort  Wear a good support bra if your breasts are sore.  Take warm water baths (sitz baths) to soothe pain or discomfort caused by hemorrhoids. Use hemorrhoid cream if your doctor says it is okay.  Rest with your legs raised if you have leg cramps or low back pain.  If you have puffy, bulging veins (varicose veins) in your legs: ? Wear support hose or compression stockings as told by your doctor. ? Raise (elevate) your feet for 15 minutes, 3-4 times a day. ? Limit salt in your food. Prenatal care  Schedule your prenatal visits by the twelfth week of pregnancy.  Write down your questions. Take them to your prenatal visits.  Keep all your prenatal visits as told by your doctor. This is important. Safety  Wear your seat belt at all times when driving.  Make a list of emergency phone numbers. The list should include numbers for family, friends, the hospital, and police and fire departments. General instructions  Ask your doctor for a referral to a local prenatal class. Begin classes no later than at the start of month 6 of your pregnancy.  Ask for help if you need counseling or if you need help with nutrition. Your doctor can give you advice or tell you where to go for help.  Do not use hot tubs, steam rooms, or saunas.  Do not douche or use tampons or scented sanitary pads.  Do not cross your legs for long periods of time.  Avoid all herbs and alcohol. Avoid drugs that are not approved by your doctor.  Do not use any tobacco products, including cigarettes, chewing tobacco, and electronic cigarettes. If you need help quitting, ask your doctor. You may get counseling or other support to help you quit.  Avoid cat litter boxes and soil used by cats. These carry germs that can cause birth defects in the baby and can cause a loss of your baby (miscarriage) or  stillbirth.  Visit your dentist. At home, brush your teeth with a soft toothbrush. Be gentle when you floss. Contact a doctor if:  You are dizzy.  You have mild cramps or pressure in your lower belly.  You have a nagging pain in your belly area.  You continue to feel sick to your stomach, you throw up, or you have watery poop (diarrhea).  You have a bad smelling fluid coming from your vagina.  You have pain when you pee (urinate).  You have increased puffiness (swelling) in your face, hands, legs, or ankles. Get help right away if:  You have a fever.  You are leaking fluid from your vagina.  You have spotting or bleeding from your vagina.  You have very bad belly cramping or pain.  You gain or lose weight rapidly.  You throw up blood. It may look like coffee grounds.  You are around people who have [place] measles, fifth disease, or chickenpox.  You have a very bad headache.  You have shortness of breath.  You have any kind of trauma, such as from a fall or a car accident. Summary  The first trimester of pregnancy is from week 1 until the end of week 13 (months 1 through 3).  To take care of yourself and your unborn baby, you will need to eat healthy meals, take medicines only if your doctor tells you to do so, and do activities that are safe for you and your baby.  Keep all follow-up visits as told by your doctor. This is important as your doctor will have to ensure that your baby is healthy and growing well. This information is not intended to replace advice given to you by your health care provider. Make sure you discuss any questions you have with your health care provider. Document Released: 02/19/2008 Document Revised: 09/10/2016 Document Reviewed: 09/10/2016 Elsevier Interactive Patient Education  2017 [employer]

## 2017-11-17 ENCOUNTER — Ambulatory Visit: Payer: 59 | Admitting: Physician Assistant

## 2017-11-18 ENCOUNTER — Ambulatory Visit (HOSPITAL_BASED_OUTPATIENT_CLINIC_OR_DEPARTMENT_OTHER)
Admission: RE | Admit: 2017-11-18 | Discharge: 2017-11-18 | Disposition: A | Discharge status: Home / Self Care | Payer: 59 | Source: Ambulatory Visit | Attending: Physician Assistant | Admitting: Physician Assistant

## 2017-11-18 ENCOUNTER — Other Ambulatory Visit: Payer: Self-pay | Admitting: Physician Assistant

## 2017-11-18 ENCOUNTER — Encounter: Payer: Self-pay | Admitting: Physician Assistant

## 2017-11-18 ENCOUNTER — Encounter (HOSPITAL_BASED_OUTPATIENT_CLINIC_OR_DEPARTMENT_OTHER): Payer: Self-pay

## 2017-11-18 ENCOUNTER — Other Ambulatory Visit: Payer: Self-pay

## 2017-11-18 ENCOUNTER — Ambulatory Visit: Payer: 59 | Admitting: Physician Assistant

## 2017-11-18 VITALS — BP 126/71 | HR 85

## 2017-11-18 DIAGNOSIS — O209 Hemorrhage in early pregnancy, unspecified: Secondary | ICD-10-CM | POA: Diagnosis not present

## 2017-11-18 DIAGNOSIS — O4690 Antepartum hemorrhage, unspecified, unspecified trimester: Secondary | ICD-10-CM

## 2017-11-18 LAB — HCG, QUANTITATIVE, PREGNANCY: HCG, Total, QN: 53 m[IU]/mL

## 2017-11-18 LAB — CBC
HEMATOCRIT: 41.1 % (ref 35.0–45.0)
HEMOGLOBIN: 14.5 g/dL (ref 11.7–15.5)
MCH: 31.9 pg (ref 27.0–33.0)
MCHC: 35.3 g/dL (ref 32.0–36.0)
MCV: 90.5 fL (ref 80.0–100.0)
MPV: 10 fL (ref 7.5–12.5)
Platelets: 424 10*3/uL — ABNORMAL HIGH (ref 140–400)
RBC: 4.54 10*6/uL (ref 3.80–5.10)
RDW: 12.5 % (ref 11.0–15.0)
WBC: 14.8 10*3/uL — ABNORMAL HIGH (ref 3.8–10.8)

## 2017-11-18 LAB — ABO AND RH

## 2017-11-18 MED ORDER — RHO D IMMUNE GLOBULIN 1500 UNIT/2ML IJ SOSY: 300.0000 ug | PREFILLED_SYRINGE | Freq: Once | INTRAMUSCULAR | 0 refills | Status: AC

## 2017-11-18 NOTE — Patient Instructions (Signed)

## 2017-11-18 NOTE — Progress Notes (Signed)
Will bring back to clinic to administer.

## 2017-11-18 NOTE — Progress Notes (Signed)
Pt is RH negative. B negative Sent rhogam shot to pharmacy. Need to pick up and come into clinic to get injection.

## 2017-11-18 NOTE — Progress Notes (Signed)
   Subjective:    Patient ID: Shelby Cook, female    DOB: [birth year], 32 y.o.   MRN: [id number]  HPI  Patient is a 32 year old female with recent positive serum hCG on 11/12/17 who presents with her husband to the clinic with vaginal bleeding and cramping.  Patient hCG level on 11/12/17 was 163.  This is very low but our plan was to repeat hCG at 1 week to make sure pregnancy was progressing as normal.  She started bleeding and cramping over the weekend.  It got a little bit better but then this morning it was very heavy.  She is very sad and upset.  She does wish to continue trying for future pregnancies.  .. Active Ambulatory Problems    Diagnosis Date Noted  . OBESITY NOS 02/16/2007  . ALLERGIC RHINITIS 02/16/2007  . OLIGOMENORRHEA 02/16/2007  . 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 HEADACHE 07/25/2010  . 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 Seasonal allergies 08/04/2015  . Anxiety and depression 08/04/2015  . Multiple allergies 08/04/2015  . Environmental allergies 08/18/2015  . Right knee pain 10/23/2015  . Abnormal weight gain 10/23/2015  . Morbid obesity (HCC) 10/23/2015   Resolved Ambulatory Problems    Diagnosis Date Noted  . No Resolved Ambulatory Problems   Past Medical History:  Diagnosis Date  . Allergy        Review of Systems See HPI.     Objective:   Physical Exam  Constitutional: She appears well-developed and well-nourished.  HENT:  Head: Normocephalic and atraumatic.  Abdominal: Soft. Bowel sounds are normal. She exhibits no distension and no mass. There is no tenderness. There is no rebound and no guarding.  Genitourinary:  Genitourinary Comments: Bright red to dark brown blood coming from cervical os that appears to be slightly dilated.   Psychiatric:  Very tearful.           Assessment & Plan:  Marland Kitchen.Marland Kitchen.Diagnoses and all orders for this visit:  Vaginal bleeding in pregnancy, first trimester -     B-HCG Quant -     CBC -      ABO AND RH  -     US OB Transvaginal  Discussed with patient this does look like a miscarriage.  She is actively bleeding out of the cervix.  We will get some lab work to confirm she does not need a RhoGam shot.  Will also get an ultrasound to rule out any ectopic pregnancy.  Reassurance given that she seems to be excavating herself the fetal material.  Discussed the one in 4 miscarriage rate and how this does not mean all future pregnancies ended in miscarriage.  Patient does wish to stay off birth control and try for pregnancy in the near future.  I encouraged her to wait at least 2 cycles.  Encouraged her to stay on her prenatal vitamin and continue healthy diet and exercise.  Follow-up with any other concerns.  Discussed with patient some grieving is expected;however, if feel like not able to function and/or sleep at times we can give an as needed medication to help for a few weeks. Follow up as needed. Consider support group for people that have gone through miscarriages or reach out to people in your life who have gone through the same thing.   Marland Kitchen..Spent 30 minutes with patient and greater than 50 percent of visit spent counseling patient regarding treatment plan.

## 2017-11-18 NOTE — Progress Notes (Signed)
Call pt: U/S showed a gestational sac in the endometrium(where it needs to be) however no embryo/yolk sac/cardiac activity noted. In combination with decreasing HcG levels it appears like pregnancy loss. I do want to make sure you completely clear gestational sac before trying to get pregnant again. Will recheck ultrasound in 1 week and you have stopped bleeding.

## 2017-11-18 NOTE — Progress Notes (Signed)
Call pt: unfortunately HCG levels have dropped by over a 100 mIU when it should be doubling. This is indicative of pregnancy loss.  Type and cross not back yet.

## 2017-11-19 ENCOUNTER — Ambulatory Visit (INDEPENDENT_AMBULATORY_CARE_PROVIDER_SITE_OTHER): Payer: 59 | Admitting: Physician Assistant

## 2017-11-19 DIAGNOSIS — Z2913 Encounter for prophylactic Rho(D) immune globulin: Secondary | ICD-10-CM | POA: Diagnosis not present

## 2017-11-19 MED ORDER — RHO D IMMUNE GLOBULIN 1500 UNIT/2ML IJ SOSY
300.0000 ug | PREFILLED_SYRINGE | Freq: Once | INTRAMUSCULAR | Status: AC
  Administered 2017-11-19: 300 ug via INTRAMUSCULAR

## 2017-11-19 NOTE — Progress Notes (Signed)
HPI: Patient is here due to testing RH Negative and having a recent miscarriage in need of Rhogam  injection. Patient denies chest pain, shortness of breath, rashes, palpitations, headaches.  Assessment and Plan: Patient tolerated injection - RUOQ, well without any complications. Patient remained in our office for 20 minutes after injection to ensure of no immediate allergic reactions - advised protocol of when to seek help. Patient stated she understood.   Agree with above plan. Tandy GawJade Breeback PA-C

## 2017-12-09 ENCOUNTER — Encounter: Payer: Self-pay | Admitting: Physician Assistant

## 2017-12-09 NOTE — Telephone Encounter (Signed)
Ok to order follow up pelvic/transvaginal u/s to follow up on miscarriage.

## 2017-12-10 ENCOUNTER — Other Ambulatory Visit: Payer: Self-pay | Admitting: Physician Assistant

## 2017-12-10 ENCOUNTER — Telehealth: Payer: Self-pay | Admitting: Physician Assistant

## 2017-12-10 DIAGNOSIS — O039 Complete or unspecified spontaneous abortion without complication: Secondary | ICD-10-CM

## 2017-12-10 NOTE — Telephone Encounter (Signed)
-----   Message from Shelby Cook sent at 12/10/2017 10:42 AM EDT ----- Regarding: ultrasound Shelby DamHey Milburn Freeney:  MRN [id number]; [facility] called me and stated that Dr. Caleen EssexBreeback was supposed to have put in orders for her to have a follow-up ultrasound.  I don't see the orders.   Can you inquire about that for me?  Thanks!   Eber Jonesarolyn

## 2017-12-10 NOTE — Telephone Encounter (Signed)
I had routed note to angela I thought to order this.  Follow up u/s post miscarriage and cystic formation. I will placed.

## 2017-12-10 NOTE — Telephone Encounter (Signed)
Ordered

## 2017-12-11 ENCOUNTER — Other Ambulatory Visit: Payer: Self-pay

## 2017-12-11 DIAGNOSIS — O039 Complete or unspecified spontaneous abortion without complication: Secondary | ICD-10-CM

## 2017-12-12 ENCOUNTER — Ambulatory Visit (HOSPITAL_BASED_OUTPATIENT_CLINIC_OR_DEPARTMENT_OTHER)
Admission: RE | Admit: 2017-12-12 | Discharge: 2017-12-12 | Disposition: A | Discharge status: Home / Self Care | Payer: 59 | Source: Ambulatory Visit | Attending: Physician Assistant | Admitting: Physician Assistant

## 2017-12-12 DIAGNOSIS — O039 Complete or unspecified spontaneous abortion without complication: Secondary | ICD-10-CM | POA: Insufficient documentation

## 2017-12-14 NOTE — Progress Notes (Signed)
Call pt: Ultrasound shows that all pregnancy material has cleared and uterus and ovaries look good.

## 2017-12-16 ENCOUNTER — Emergency Department
Admission: EM | Admit: 2017-12-16 | Discharge: 2017-12-16 | Disposition: A | Discharge status: Home / Self Care | Payer: 59 | Source: Home / Self Care | Attending: Family Medicine | Admitting: Family Medicine

## 2017-12-16 ENCOUNTER — Other Ambulatory Visit: Payer: Self-pay

## 2017-12-16 DIAGNOSIS — L500 Allergic urticaria: Secondary | ICD-10-CM | POA: Diagnosis not present

## 2017-12-16 MED ORDER — METHYLPREDNISOLONE ACETATE 80 MG/ML IJ SUSP
80.0000 mg | Freq: Once | INTRAMUSCULAR | Status: AC
  Administered 2017-12-16: 80 mg via INTRAMUSCULAR

## 2017-12-16 NOTE — ED Provider Notes (Signed)
[facility]    CSN: [id number] Arrival date & time: 12/16/17  1056     History   Chief Complaint Chief Complaint  Patient presents with  . Rash    HPI Shelby CoveDanielle Cook is a 32 y.o. female.   Yesterday patient developed mild hives on her back and legs, improved after taking Benadryl.  Hives occurred again this morning, and she felt like she had mild swelling of her eye lids and tongue.  She denies swelling of lips, difficulty swallowing, wheezing, and shortness of breath.  Her symptoms have resolved since arriving here. She has a history of perennial rhinitis.  The history is provided by the patient.  Urticaria  This is a new problem. The current episode started yesterday. The problem occurs constantly. The problem has been resolved. Pertinent negatives include no chest pain, no headaches and no shortness of breath. Nothing aggravates the symptoms. Relieved by: Benadryl. Treatments tried: Benadryl. The treatment provided significant relief.    Past Medical History:  Diagnosis Date  . Allergy     Patient Active Problem List   Diagnosis Date Noted  . Need for rhogam due to Rh negative mother 11/19/2017  . Right knee pain 10/23/2015  . Abnormal weight gain 10/23/2015  . Morbid obesity (HCC) 10/23/2015  . Environmental allergies 08/18/2015  . Seasonal allergies 08/04/2015  . Anxiety and depression 08/04/2015  . Multiple allergies 08/04/2015  . HEADACHE 07/25/2010  . 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 OBESITY NOS 02/16/2007  . ALLERGIC RHINITIS 02/16/2007  . OLIGOMENORRHEA 02/16/2007  . ACNE NEC 02/16/2007    Past Surgical History:  Procedure Laterality Date  . WISDOM TOOTH EXTRACTION      OB History    Gravida  2   Para      Term      Preterm      AB      Living        SAB      TAB      Ectopic      Multiple      Live Births               Home Medications    Prior to Admission medications     Medication Sig Start Date End Date Taking? Authorizing Provider  EPINEPHrine 0.3 mg/0.3 mL IJ SOAJ injection Inject 0.3 mLs (0.3 mg total) into the muscle once. 03/18/16   Breeback, Jade L, PA-C  loratadine (CLARITIN) 10 MG tablet Take 10 mg by mouth daily.    [provider]  montelukast (SINGULAIR) 10 MG tablet TAKE 1 TABLET (10 MG TOTAL) BY MOUTH AT BEDTIME. 11/01/16   Breeback, Jade L, PA-C  Prenatal Vit-Fe Fumarate-FA (PRENATAL MULTIVITAMIN) TABS tablet Take 1 tablet by mouth daily at 12 noon.    [provider]    Family History Family History  Problem Relation Age of Onset  . Drug abuse Father   . Mental illness Father        bipolar  . Diabetes Father   . Hypertension Father   . Heart attack Father   . Osteoporosis Mother   . Scoliosis Sister   . Cancer Paternal Grandmother        breast and lung  . Diabetes Paternal Grandfather   . Heart attack Paternal Grandfather   . Allergies Sister     Social History Social History   Tobacco Use  . Smoking status: Former Smoker    Packs/day: 0.50  Types: Cigarettes    Last attempt to quit: 06/11/2015    Years since quitting: 2.5  . Smokeless tobacco: Never Used  Substance Use Topics  . Alcohol use: Yes    Alcohol/week: 0.0 oz  . Drug use: No     Allergies   Celexa [citalopram hydrobromide] and Flonase [fluticasone propionate]   Review of Systems Review of Systems  Constitutional: Negative.   HENT: Negative for rhinorrhea, sneezing, sore throat and trouble swallowing.   Eyes: Negative.   Respiratory: Negative for cough, chest tightness, shortness of breath, wheezing and stridor.   Cardiovascular: Negative for chest pain, palpitations and leg swelling.  Genitourinary: Negative.   Musculoskeletal: Negative.   Skin:       hives  Neurological: Negative for headaches.     Physical Exam Triage Vital Signs ED Triage Vitals [12/16/17 1204]  Enc Vitals Group     BP 124/79     Pulse Rate 83      Resp      Temp 98.8 F (37.1 C)     Temp Source Oral     SpO2 100 %     Weight 264 lb (119.7 kg)     Height 5\' 7"  (1.702 m)     Head Circumference      Peak Flow      Pain Score 0     Pain Loc      Pain Edu?      Excl. in GC?    No data found.  Updated Vital Signs BP 124/79 (BP Location: Right Arm)   Pulse 83   Temp 98.8 F (37.1 C) (Oral)   Ht 5\' 7"  (1.702 m)   Wt 264 lb (119.7 kg)   SpO2 100%   BMI 41.35 kg/m   Visual Acuity Right Eye Distance:   Left Eye Distance:   Bilateral Distance:    Right Eye Near:   Left Eye Near:    Bilateral Near:     Physical Exam Nursing notes and Vital Signs reviewed. Appearance:  Patient appears stated age, and in no acute distress Eyes:  Pupils are equal, round, and reactive to light and accomodation.  Extraocular movement is intact.  Conjunctivae are not inflamed  Ears:  Canals normal.  Tympanic membranes normal.  Nose:  Mildly congested turbinates.  No sinus tenderness.   Mouth:  Normal  Pharynx:  Normal Neck:  Supple. No adenopathy. Lungs:  No respiratory distress. Clear to auscultation.  Breath sounds are equal.  Moving air well. Heart:  Regular rate and rhythm without murmurs, rubs, or gallops.  Abdomen:  Nontender without masses or hepatosplenomegaly.  Bowel sounds are present.  No CVA or flank tenderness.  Extremities:  No edema.  Skin:  No rash present.  No facial swelling present.  UC Treatments / Results  Labs (all labs ordered are listed, but only abnormal results are displayed) Labs Reviewed - No data to display  EKG None Radiology No results found.  Procedures Procedures (including critical care time)  Medications Ordered in UC Medications  methylPREDNISolone acetate (DEPO-MEDROL) injection 80 mg (80 mg Intramuscular Given 12/16/17 1256)     Initial Impression / Assessment and Plan / UC Course  I have reviewed the triage vital signs and the nursing notes.  Pertinent labs & imaging results that were  available during my care of the patient were reviewed by me and considered in my medical decision making (see chart for details).    Administered Depo Medrol 80mg  IM May take  a non-sedating antihistamine such as Zyrtec daytime.  May take Benadryl 50mg  at bedtime.  Add Pepcid 20mg  once daily. If symptoms become significantly worse during the night or over the weekend, proceed to the local emergency room.     Final Clinical Impressions(s) / UC Diagnoses   Final diagnoses:  Allergic urticaria    ED Discharge Orders    None          Lattie Haw, MD 12/23/17 1721

## 2017-12-16 NOTE — ED Triage Notes (Signed)
Yesterday had hives on back and legs.  Took benadryl, and resolved for about 2 hours.  Hives came back.  This morning, same, took benadryl about 1045.  Felt like eye lids and tongue were swollen.  Resolved since being here.

## 2017-12-16 NOTE — Discharge Instructions (Addendum)
May take a non-sedating antihistamine such as Zyrtec daytime.  May take Benadryl 50mg  at bedtime.  Add Pepcid 20mg  once daily. If symptoms become significantly worse during the night or over the weekend, proceed to the local emergency room.

## 2017-12-24 ENCOUNTER — Ambulatory Visit: Payer: 59 | Admitting: Obstetrics and Gynecology

## 2017-12-24 ENCOUNTER — Encounter: Payer: Self-pay | Admitting: Obstetrics and Gynecology

## 2017-12-24 VITALS — BP 116/74 | HR 94 | Resp 16 | Ht 67.0 in | Wt 259.0 lb

## 2017-12-24 DIAGNOSIS — Z3169 Encounter for other general counseling and advice on procreation: Secondary | ICD-10-CM

## 2017-12-24 NOTE — Progress Notes (Signed)
32 yo G1P0010 with BMI 40 with recent miscarriage in March here for preconception counseling. Patient reports being in good health without any chronic conditions. She admits to being overweight and is in the process of losing weight through consuming a healthier diet and exercising regularly. She reports a long standing history of irregular cycles occurring every 4-6 months.   Past Medical History:  Diagnosis Date  . Allergy   . Vaginal Pap smear, abnormal    Past Surgical History:  Procedure Laterality Date  . WISDOM TOOTH EXTRACTION     Family History  Problem Relation Age of Onset  . Drug abuse Father   . Mental illness Father        bipolar  . Diabetes Father   . Hypertension Father   . Heart attack Father   . Osteoporosis Mother   . Scoliosis Sister   . Cancer Paternal Grandmother        breast and lung  . Diabetes Paternal Grandfather   . Heart attack Paternal Grandfather   . Allergies Sister    Social History   Tobacco Use  . Smoking status: Former Smoker    Packs/day: 0.50    Types: Cigarettes    Last attempt to quit: 06/11/2015    Years since quitting: 2.5  . Smokeless tobacco: Never Used  Substance Use Topics  . Alcohol use: Yes    Alcohol/week: 0.0 oz  . Drug use: No   ROS See pertinent in HPI  Blood pressure 116/74, pulse 94, resp. rate 16, height 5\' 7"  (1.702 m), weight 259 lb (117.5 kg). GENERAL: Well-developed, well-nourished female in no acute distress.  NEURO: alert and oriented x 3  A/P 32 yo G1P0010 here for preconception counseling - TSH and A1c ordered - encouraged her efforts in her weight loss journey - Continue taking prenatal vitamins - Patient to keep a menstrual calendar and return if no menses in 3 months to discuss ovulation assistance - Normal pap smear 10/2015 - RTC prn

## 2017-12-25 LAB — HEMOGLOBIN A1C
HEMOGLOBIN A1C: 5.5 %{Hb} (ref <5.7)
MEAN PLASMA GLUCOSE: 111 (calc)
eAG (mmol/L): 6.2 (calc)

## 2017-12-25 LAB — TSH: TSH: 1.39 m[IU]/L

## 2018-04-03 ENCOUNTER — Encounter: Payer: Self-pay | Admitting: Physician Assistant

## 2018-04-03 ENCOUNTER — Ambulatory Visit (INDEPENDENT_AMBULATORY_CARE_PROVIDER_SITE_OTHER): Payer: 59 | Admitting: Physician Assistant

## 2018-04-03 DIAGNOSIS — Z889 Allergy status to unspecified drugs, medicaments and biological substances status: Secondary | ICD-10-CM | POA: Diagnosis not present

## 2018-04-03 DIAGNOSIS — F419 Anxiety disorder, unspecified: Secondary | ICD-10-CM | POA: Diagnosis not present

## 2018-04-03 DIAGNOSIS — F329 Major depressive disorder, single episode, unspecified: Secondary | ICD-10-CM

## 2018-04-03 DIAGNOSIS — F32A Depression, unspecified: Secondary | ICD-10-CM

## 2018-04-03 DIAGNOSIS — Z9109 Other allergy status, other than to drugs and biological substances: Secondary | ICD-10-CM

## 2018-04-03 DIAGNOSIS — Z716 Tobacco abuse counseling: Secondary | ICD-10-CM

## 2018-04-03 MED ORDER — BUPROPION HCL ER (SR) 150 MG PO TB12: 150.0000 mg | ORAL_TABLET | Freq: Two times a day (BID) | ORAL | 4 refills | Status: DC

## 2018-04-03 MED ORDER — MONTELUKAST SODIUM 10 MG PO TABS: 10.0000 mg | ORAL_TABLET | Freq: Every day | ORAL | 4 refills | Status: DC

## 2018-04-03 MED ORDER — PHENTERMINE HCL 37.5 MG PO TABS: 37.5000 mg | ORAL_TABLET | Freq: Every day | ORAL | 0 refills | Status: DC

## 2018-04-03 NOTE — Progress Notes (Signed)
Subjective:    Patient ID: Shelby Cook, female    DOB: Nov 25, 1985, 32 y.o.   MRN: [id number]  HPI Pt is a 32 yr old female presenting to the clinic for medication refill. Pt says she is doing well. She started a new job 5 months ago which she says she is enjoying. Her stress level has decreased. She says her mood is good as well. She has not been on her Wellbutrin since December and is requesting to be back on it. She says it works best for her and denies side effects. She says she is also trying to quit smoking. She smokes 1 pack of cigarettes a week. Denies CP, SOB, fever, chills or any other symptoms.  Weight loss: Pt says her new job is pretty sedentary and she has been having a hard time losing weight. She says she is watching her diet and walking at night with her dogs. She mentioned her insurance is not covering any weight loss medication but is interested in trying Phentermine.   Allergies: Needs Singulair refill. No problems with the medicine. Allergies are better.  .. Active Ambulatory Problems    Diagnosis Date Noted  . OBESITY NOS 02/16/2007  . ALLERGIC RHINITIS 02/16/2007  . OLIGOMENORRHEA 02/16/2007  . 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 HEADACHE 07/25/2010  . 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 Seasonal allergies 08/04/2015  . Anxiety and depression 08/04/2015  . Multiple allergies 08/04/2015  . Environmental allergies 08/18/2015  . Right knee pain 10/23/2015  . Abnormal weight gain 10/23/2015  . Morbid obesity (HCC) 10/23/2015  . Need for rhogam due to Rh negative mother 11/19/2017   Resolved Ambulatory Problems    Diagnosis Date Noted  . No Resolved Ambulatory Problems   Past Medical History:  Diagnosis Date  . Allergy   . Vaginal Pap smear, abnormal       Review of Systems  Constitutional: Negative for chills, fatigue and fever.  HENT: Negative.   Eyes: Negative.   Respiratory: Negative for cough and shortness of breath.    Cardiovascular: Negative for chest pain and palpitations.  Gastrointestinal: Negative.   Genitourinary: Negative.   Psychiatric/Behavioral: Negative.   All other systems reviewed and are negative.      Objective:   Physical Exam  Constitutional: She appears well-developed and well-nourished. No distress.  HENT:  Head: Normocephalic and atraumatic.  Eyes: Pupils are equal, round, and reactive to light. Conjunctivae and EOM are normal.  Neck: Normal range of motion.  Cardiovascular: Normal rate and regular rhythm.  Pulmonary/Chest: Effort normal and breath sounds normal.  Skin: Skin is warm and dry.      Assessment & Plan:  Marland KitchenMarland KitchenQuintella was seen today for annual exam.  Diagnoses and all orders for this visit:  Morbid obesity (HCC) -     phentermine (ADIPEX-P) 37.5 MG tablet; Take 1 tablet (37.5 mg total) by mouth daily before breakfast.  Anxiety and depression -     buPROPion (WELLBUTRIN SR) 150 MG 12 hr tablet; Take 1 tablet (150 mg total) by mouth 2 (two) times daily.  Multiple allergies -     montelukast (SINGULAIR) 10 MG tablet; Take 1 tablet (10 mg total) by mouth at bedtime.  Environmental allergies -     montelukast (SINGULAIR) 10 MG tablet; Take 1 tablet (10 mg total) by mouth at bedtime.   Vitals:   04/03/18 0835  BP: 132/80  Pulse: 96   .Marland Kitchen Depression screen [facility] 2/9 04/03/2018 11/01/2016  Decreased Interest  1 2  Down, Depressed, Hopeless 1 2  PHQ - 2 Score 2 4  Altered sleeping 1 2  Tired, decreased energy 1 1  Change in appetite 1 2  Feeling bad or failure about yourself  1 2  Trouble concentrating 0 2  Moving slowly or fidgety/restless 0 0  Suicidal thoughts 0 1  PHQ-9 Score 6 14  Difficult doing work/chores Somewhat difficult -   .. GAD 7 : Generalized Anxiety Score 04/03/2018 11/01/2016  Nervous, Anxious, on Edge 2 3  Control/stop worrying 2 2  Worry too much - different things 1 2  Trouble relaxing 2 2  Restless 1 2  Easily annoyed or irritable  2 3  Afraid - awful might happen 0 2  Total GAD 7 Score 10 16  Anxiety Difficulty Somewhat difficult -     -Vitals okay. Blood pressure a little elevated. Will follow this. Not on any blood pressure medicine.  -Singulair and Wellbutrin refill done. Pt wanting to quit smoking so started her on Wellbutrin sustained release BID dose for both smoking cessation and mood. Discussed tapering down weekly on cigarettes.    -Phentermine started. Pt told this is a controlled substance and is a short term weight loss medicine. Pt voiced understanding. Follow up in one month. Discussed 150 minutes of exercise and increasing protein in diet.   Follow up in 4 weeks.   Marland Kitchen..Spent 30 minutes with patient and greater than 50 percent of visit spent counseling patient regarding treatment plan.   Marland Kitchen.Harlon Flor.I, Aureliano Oshields PA-C, have reviewed and agree with the above documentation in it's entirety.

## 2018-04-20 ENCOUNTER — Encounter: Payer: Self-pay | Admitting: *Deleted

## 2018-04-20 ENCOUNTER — Other Ambulatory Visit: Payer: Self-pay

## 2018-04-20 ENCOUNTER — Encounter: Payer: Self-pay | Admitting: Physician Assistant

## 2018-04-20 ENCOUNTER — Emergency Department
Admission: EM | Admit: 2018-04-20 | Discharge: 2018-04-20 | Disposition: A | Discharge status: Home / Self Care | Payer: 59 | Source: Home / Self Care | Attending: Family Medicine | Admitting: Family Medicine

## 2018-04-20 DIAGNOSIS — L02214 Cutaneous abscess of groin: Secondary | ICD-10-CM | POA: Diagnosis not present

## 2018-04-20 MED ORDER — CEPHALEXIN 500 MG PO CAPS: 500.0000 mg | ORAL_CAPSULE | Freq: Two times a day (BID) | ORAL | 0 refills | Status: DC

## 2018-04-20 NOTE — Discharge Instructions (Signed)
°  Keep wound clean with warm water and mild soap. You may apply an over the counter antibiotic ointment for up to 4-5 days.  Please take antibiotics as prescribed and be sure to complete entire course even if you start to feel better to ensure infection does not come back. Please follow up with family medicine in 4-5 days if not improving, sooner if worsening. You may return to urgent care if needed.

## 2018-04-20 NOTE — ED Provider Notes (Signed)
[facility]    CSN: [id number] Arrival date & time: 04/20/18  0840     History   Chief Complaint Chief Complaint  Patient presents with  . Rash    HPI Shelby CoveDanielle Branam is a 32 y.o. female.   HPI Shelby Cook is a 32 y.o. female presenting to UC with c/o an irritated red area of skin on her Left middle upper thigh that has been there for about 1 month. It has been irritated by her underwear that rubs against it.  Yesterday, after [place], the area became painful and bled a small amount this morning. Pain is burning, mild at this time. No fever or chills. No prior hx of abscesses or boils.   Past Medical History:  Diagnosis Date  . Allergy   . Vaginal Pap smear, abnormal     Patient Active Problem List   Diagnosis Date Noted  . Encounter for smoking cessation counseling 04/03/2018  . Need for rhogam due to Rh negative mother 11/19/2017  . Right knee pain 10/23/2015  . Abnormal weight gain 10/23/2015  . Morbid obesity (HCC) 10/23/2015  . Environmental allergies 08/18/2015  . Seasonal allergies 08/04/2015  . Anxiety and depression 08/04/2015  . Multiple allergies 08/04/2015  . HEADACHE 07/25/2010  . 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 OBESITY NOS 02/16/2007  . ALLERGIC RHINITIS 02/16/2007  . OLIGOMENORRHEA 02/16/2007  . ACNE NEC 02/16/2007    Past Surgical History:  Procedure Laterality Date  . WISDOM TOOTH EXTRACTION      OB History    Gravida  1   Para      Term      Preterm      AB  1   Living        SAB  1   TAB      Ectopic      Multiple      Live Births               Home Medications    Prior to Admission medications   Medication Sig Start Date End Date Taking? Authorizing Provider  buPROPion (WELLBUTRIN SR) 150 MG 12 hr tablet Take 1 tablet (150 mg total) by mouth 2 (two) times daily. 04/03/18   Breeback, Jade L, PA-C  cephALEXin (KEFLEX) 500 MG capsule Take 1  capsule (500 mg total) by mouth 2 (two) times daily. 04/20/18   Lurene ShadowPhelps, Teal Bontrager O, PA-C  diphenhydrAMINE (BENADRYL) 25 MG tablet Take 25 mg by mouth at bedtime.    [provider]  EPINEPHrine 0.3 mg/0.3 mL IJ SOAJ injection Inject 0.3 mLs (0.3 mg total) into the muscle once. 03/18/16   Breeback, Jade L, PA-C  loratadine (CLARITIN) 10 MG tablet Take 10 mg by mouth daily.    [provider]  montelukast (SINGULAIR) 10 MG tablet Take 1 tablet (10 mg total) by mouth at bedtime. 04/03/18   Jomarie LongsBreeback, Jade L, PA-C  phentermine (ADIPEX-P) 37.5 MG tablet Take 1 tablet (37.5 mg total) by mouth daily before breakfast. 04/03/18   Breeback, Lonna CobbJade L, PA-C  Prenatal Vit-Fe Fumarate-FA (PRENATAL MULTIVITAMIN) TABS tablet Take 1 tablet by mouth daily at 12 noon.    [provider]    Family History Family History  Problem Relation Age of Onset  . Drug abuse Father   . Mental illness Father        bipolar  . Diabetes Father   . Hypertension Father   . Heart attack  Father   . Osteoporosis Mother   . Scoliosis Sister   . Cancer Paternal Grandmother        breast and lung  . Diabetes Paternal Grandfather   . Heart attack Paternal Grandfather   . Allergies Sister     Social History Social History   Tobacco Use  . Smoking status: Former Smoker    Packs/day: 0.50    Types: Cigarettes    Last attempt to quit: 06/11/2015    Years since quitting: 2.8  . Smokeless tobacco: Never Used  Substance Use Topics  . Alcohol use: Yes    Alcohol/week: 0.0 oz  . Drug use: No     Allergies   Celexa [citalopram hydrobromide] and Flonase [fluticasone propionate]   Review of Systems Review of Systems  Musculoskeletal: Negative for arthralgias and myalgias.  Skin: Positive for color change. Negative for wound.     Physical Exam Triage Vital Signs ED Triage Vitals  Enc Vitals Group     BP 04/20/18 0856 133/82     Pulse Rate 04/20/18 0856 97     Resp 04/20/18 0856 14     Temp  04/20/18 0856 98.2 F (36.8 C)     Temp Source 04/20/18 0856 Oral     SpO2 04/20/18 0856 99 %     Weight 04/20/18 0857 260 lb (117.9 kg)     Height --      Head Circumference --      Peak Flow --      Pain Score 04/20/18 0856 3     Pain Loc --      Pain Edu? --      Excl. in GC? --    No data found.  Updated Vital Signs BP 133/82 (BP Location: Right Arm)   Pulse 97   Temp 98.2 F (36.8 C) (Oral)   Resp 14   Wt 260 lb (117.9 kg)   LMP 03/20/2018   SpO2 99%   BMI 40.72 kg/m   Visual Acuity Right Eye Distance:   Left Eye Distance:   Bilateral Distance:    Right Eye Near:   Left Eye Near:    Bilateral Near:     Physical Exam  Constitutional: She is oriented to person, place, and time. She appears well-developed and well-nourished.  HENT:  Head: Normocephalic and atraumatic.  Eyes: EOM are normal.  Neck: Normal range of motion.  Cardiovascular: Normal rate.  Pulmonary/Chest: Effort normal.  Musculoskeletal: Normal range of motion.  Neurological: She is alert and oriented to person, place, and time.  Skin: Skin is warm and dry.     Left medial groin: 1cm erythematous pustule. No active bleeding or drainage. Mild tenderness.  Psychiatric: She has a normal mood and affect. Her behavior is normal.  Nursing note and vitals reviewed.    UC Treatments / Results  Labs (all labs ordered are listed, but only abnormal results are displayed) Labs Reviewed - No data to display  EKG None  Radiology No results found.  Procedures Incision and Drainage Date/Time: 04/20/2018 1:57 PM Performed by: Lurene Shadow, PA-C Authorized by: Lattie Haw, MD   Consent:    Consent obtained:  Verbal   Consent given by:  Patient   Risks discussed:  Bleeding, incomplete drainage, infection and pain   Alternatives discussed:  No treatment Location:    Type:  Abscess   Size:  1   Location:  Lower extremity   Lower extremity location:  Leg   Leg  location:  L upper leg  (groin) Pre-procedure details:    Skin preparation:  Betadine Anesthesia (see MAR for exact dosages):    Anesthesia method:  Local infiltration   Local anesthetic:  Lidocaine 1% w/o epi Procedure type:    Complexity:  Simple Procedure details:    Incision types:  Single straight   Incision depth:  Subcutaneous   Scalpel blade:  11   Drainage:  Bloody   Drainage amount:  Scant   Packing materials:  None Post-procedure details:    Patient tolerance of procedure:  Tolerated well, no immediate complications   (including critical care time)  Medications Ordered in UC Medications - No data to display  Initial Impression / Assessment and Plan / UC Course  I have reviewed the triage vital signs and the nursing notes.  Pertinent labs & imaging results that were available during my care of the patient were reviewed by me and considered in my medical decision making (see chart for details).     Skin lesion c/w early abscess I&D successfully performed.   Final Clinical Impressions(s) / UC Diagnoses   Final diagnoses:  Abscess of left groin     Discharge Instructions      Keep wound clean with warm water and mild soap. You may apply an over the counter antibiotic ointment for up to 4-5 days.  Please take antibiotics as prescribed and be sure to complete entire course even if you start to feel better to ensure infection does not come back. Please follow up with family medicine in 4-5 days if not improving, sooner if worsening. You may return to urgent care if needed.     ED Prescriptions    Medication Sig Dispense Auth. Provider   cephALEXin (KEFLEX) 500 MG capsule Take 1 capsule (500 mg total) by mouth 2 (two) times daily. 14 capsule Lurene Shadow, PA-C     Controlled Substance Prescriptions [place] Controlled Substance Registry consulted? Not Applicable   Rolla Plate 04/20/18 1358

## 2018-04-20 NOTE — ED Triage Notes (Signed)
Patient c/o rash/irrited skin on upper thigh where her underwear rubs x 1 month. [place], site became worse and painful. Reports it bled this AM.

## 2018-04-23 ENCOUNTER — Telehealth: Payer: Self-pay | Admitting: *Deleted

## 2018-04-23 NOTE — Telephone Encounter (Signed)
Callback: Patient reports site is improving. No further questions.

## 2018-05-04 ENCOUNTER — Encounter: Payer: Self-pay | Admitting: Family Medicine

## 2018-05-04 ENCOUNTER — Ambulatory Visit (INDEPENDENT_AMBULATORY_CARE_PROVIDER_SITE_OTHER): Payer: 59 | Admitting: Family Medicine

## 2018-05-04 VITALS — BP 135/59 | HR 75 | Ht 67.0 in | Wt 256.0 lb

## 2018-05-04 DIAGNOSIS — R635 Abnormal weight gain: Secondary | ICD-10-CM

## 2018-05-04 MED ORDER — PHENTERMINE HCL 37.5 MG PO TABS: 37.5000 mg | ORAL_TABLET | Freq: Every day | ORAL | 0 refills | Status: DC

## 2018-05-04 NOTE — Progress Notes (Signed)
Patient present to clinic for weight check and BP check. Patient has been taking medications as directed. She denies any chest pain, shortness of breath or racing heart rate. Patient is taking medication as directed. Patient advised to schedule a 1 month follow up for nurse visit.

## 2018-05-04 NOTE — Patient Instructions (Signed)
Patient advised to schedule a 1 month follow up with nurse and voices understanding.

## 2018-05-04 NOTE — Progress Notes (Signed)
Agree with documentation as above.   Suleman Gunning, MD  

## 2018-05-08 ENCOUNTER — Emergency Department
Admission: EM | Admit: 2018-05-08 | Discharge: 2018-05-08 | Disposition: A | Discharge status: Home / Self Care | Payer: 59 | Source: Home / Self Care | Attending: Family Medicine | Admitting: Family Medicine

## 2018-05-08 ENCOUNTER — Other Ambulatory Visit: Payer: Self-pay

## 2018-05-08 DIAGNOSIS — K130 Diseases of lips: Secondary | ICD-10-CM | POA: Diagnosis not present

## 2018-05-08 MED ORDER — DOXYCYCLINE HYCLATE 100 MG PO CAPS: 100.0000 mg | ORAL_CAPSULE | Freq: Two times a day (BID) | ORAL | 0 refills | Status: DC

## 2018-05-08 MED ORDER — DOCOSANOL 10 % EX CREA: 1.0000 "application " | TOPICAL_CREAM | Freq: Every day | CUTANEOUS | 1 refills | Status: DC

## 2018-05-08 NOTE — ED Provider Notes (Signed)
[facility]    CSN: [id number] Arrival date & time: 05/08/18  1904     History   Chief Complaint Chief Complaint  Patient presents with  . Mouth Lesions    HPI Shelby Cook is a 32 y.o. female.   HPI  Shelby Cook is a 32 y.o. female presenting to UC with c/o of a sore on her Right lower lip that she initially thought was a pimple.  She tried to pop it on Monday but it has swelled since then.  She has tried Neosporin, acne cream and alcohol wipes but no relief.  No hx of fever blisters. She notes it kind of itches. No other symptoms. Denies bleeding or drainage from the sore.    Past Medical History:  Diagnosis Date  . Allergy   . Vaginal Pap smear, abnormal     Patient Active Problem List   Diagnosis Date Noted  . Encounter for smoking cessation counseling 04/03/2018  . Need for rhogam due to Rh negative mother 11/19/2017  . Right knee pain 10/23/2015  . Abnormal weight gain 10/23/2015  . Morbid obesity (HCC) 10/23/2015  . Environmental allergies 08/18/2015  . Seasonal allergies 08/04/2015  . Anxiety and depression 08/04/2015  . Multiple allergies 08/04/2015  . HEADACHE 07/25/2010  . 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 OBESITY NOS 02/16/2007  . ALLERGIC RHINITIS 02/16/2007  . OLIGOMENORRHEA 02/16/2007  . ACNE NEC 02/16/2007    Past Surgical History:  Procedure Laterality Date  . WISDOM TOOTH EXTRACTION      OB History    Gravida  1   Para      Term      Preterm      AB  1   Living        SAB  1   TAB      Ectopic      Multiple      Live Births               Home Medications    Prior to Admission medications   Medication Sig Start Date End Date Taking? Authorizing Provider  buPROPion (WELLBUTRIN SR) 150 MG 12 hr tablet Take 1 tablet (150 mg total) by mouth 2 (two) times daily. 04/03/18   Breeback, Lonna Cobb, PA-C  diphenhydrAMINE (BENADRYL) 25 MG tablet Take 25 mg by mouth at  bedtime.    [provider]  Docosanol (ABREVA) 10 % CREA Apply 1 application topically 5 (five) times daily. Until sore is healed 05/08/18   Lurene Shadow, PA-C  doxycycline (VIBRAMYCIN) 100 MG capsule Take 1 capsule (100 mg total) by mouth 2 (two) times daily. One po bid x 7 days 05/08/18   Lurene Shadow, PA-C  loratadine (CLARITIN) 10 MG tablet Take 10 mg by mouth daily.    [provider]  montelukast (SINGULAIR) 10 MG tablet Take 1 tablet (10 mg total) by mouth at bedtime. 04/03/18   Jomarie Longs, PA-C  phentermine (ADIPEX-P) 37.5 MG tablet Take 1 tablet (37.5 mg total) by mouth daily before breakfast. 05/04/18   Agapito Games, MD  Prenatal Vit-Fe Fumarate-FA (PRENATAL MULTIVITAMIN) TABS tablet Take 1 tablet by mouth daily at 12 noon.    [provider]    Family History Family History  Problem Relation Age of Onset  . Drug abuse Father   . Mental illness Father        bipolar  . Diabetes Father   . Hypertension Father   .  Heart attack Father   . Osteoporosis Mother   . Scoliosis Sister   . Cancer Paternal Grandmother        breast and lung  . Diabetes Paternal Grandfather   . Heart attack Paternal Grandfather   . Allergies Sister     Social History Social History   Tobacco Use  . Smoking status: Former Smoker    Packs/day: 0.50    Types: Cigarettes    Last attempt to quit: 06/11/2015    Years since quitting: 2.9  . Smokeless tobacco: Never Used  Substance Use Topics  . Alcohol use: Yes    Alcohol/week: 0.0 standard drinks  . Drug use: No     Allergies   Celexa [citalopram hydrobromide] and Flonase [fluticasone propionate]   Review of Systems Review of Systems  Constitutional: Negative for chills and fever.  HENT: Positive for mouth sores ( lower lip). Negative for facial swelling.      Physical Exam Triage Vital Signs ED Triage Vitals  Enc Vitals Group     BP 05/08/18 1916 (!) 135/96     Pulse Rate 05/08/18 1916 94       Resp --      Temp 05/08/18 1916 98.1 F (36.7 C)     Temp Source 05/08/18 1916 Oral     SpO2 05/08/18 1916 100 %     Weight 05/08/18 1917 254 lb (115.2 kg)     Height 05/08/18 1917 5\' 7"  (1.702 m)     Head Circumference --      Peak Flow --      Pain Score 05/08/18 1917 0     Pain Loc --      Pain Edu? --      Excl. in GC? --    No data found.  Updated Vital Signs BP (!) 135/96 (BP Location: Right Wrist)   Pulse 94   Temp 98.1 F (36.7 C) (Oral)   Ht 5\' 7"  (1.702 m)   Wt 254 lb (115.2 kg)   LMP 04/23/2018 (Exact Date)   SpO2 100%   BMI 39.78 kg/m   Visual Acuity Right Eye Distance:   Left Eye Distance:   Bilateral Distance:    Right Eye Near:   Left Eye Near:    Bilateral Near:     Physical Exam  Constitutional: She appears well-developed and well-nourished. No distress.  HENT:  Head: Normocephalic.  Mouth/Throat: Oropharynx is clear and moist.    Left side lower lip: pea-sized mobile firm mildly tender mass. Overlying erythema. No bleeding or discharge.   Eyes: EOM are normal.  Neck: Normal range of motion.  Cardiovascular: Normal rate.  Pulmonary/Chest: Effort normal. No respiratory distress.  Skin: Skin is warm and dry. She is not diaphoretic.  Nursing note and vitals reviewed.    UC Treatments / Results  Labs (all labs ordered are listed, but only abnormal results are displayed) Labs Reviewed - No data to display  EKG None  Radiology No results found.  Procedures Procedures (including critical care time)  Medications Ordered in UC Medications - No data to display  Initial Impression / Assessment and Plan / UC Course  I have reviewed the triage vital signs and the nursing notes.  Pertinent labs & imaging results that were available during my care of the patient were reviewed by me and considered in my medical decision making (see chart for details).     Hx and exam c/w fever blister vs skin abscess. Will tx with topical  Abreva and  oral doxycycline Encouraged f/u with PCP in 1 week if not improving.   Final Clinical Impressions(s) / UC Diagnoses   Final diagnoses:  Sore of lower lip     Discharge Instructions      Please use medications as prescribed and follow up with family medicine in 1 week if not improving.    ED Prescriptions    Medication Sig Dispense Auth. Provider   Docosanol (ABREVA) 10 % CREA Apply 1 application topically 5 (five) times daily. Until sore is healed 2 g Doroteo Glassman, Skyler Carel O, PA-C   doxycycline (VIBRAMYCIN) 100 MG capsule Take 1 capsule (100 mg total) by mouth 2 (two) times daily. One po bid x 7 days 14 capsule Lurene Shadow, [place]     Controlled Substance Prescriptions  Controlled Substance Registry consulted? Not Applicable   Rolla Plate 05/08/18 1950

## 2018-05-08 NOTE — ED Triage Notes (Signed)
Pt c/o what she thought was a pimple on her lip since Sunday. She tried to pop it on Monday and it swelled up. Has tried neosporin, acne cream and alcohol wipes.

## 2018-05-08 NOTE — Discharge Instructions (Signed)
°  Please use medications as prescribed and follow up with family medicine in 1 week if not improving.

## 2018-05-15 ENCOUNTER — Other Ambulatory Visit: Payer: Self-pay

## 2018-05-15 ENCOUNTER — Emergency Department (INDEPENDENT_AMBULATORY_CARE_PROVIDER_SITE_OTHER)
Admission: EM | Admit: 2018-05-15 | Discharge: 2018-05-15 | Disposition: A | Discharge status: Home / Self Care | Payer: 59 | Source: Home / Self Care

## 2018-05-15 DIAGNOSIS — M542 Cervicalgia: Secondary | ICD-10-CM | POA: Diagnosis not present

## 2018-05-15 DIAGNOSIS — G43009 Migraine without aura, not intractable, without status migrainosus: Secondary | ICD-10-CM

## 2018-05-15 DIAGNOSIS — R112 Nausea with vomiting, unspecified: Secondary | ICD-10-CM

## 2018-05-15 MED ORDER — METHOCARBAMOL 500 MG PO TABS: ORAL_TABLET | ORAL | 0 refills | Status: DC

## 2018-05-15 MED ORDER — KETOROLAC TROMETHAMINE 60 MG/2ML IM SOLN
60.0000 mg | Freq: Once | INTRAMUSCULAR | Status: AC
  Administered 2018-05-15: 60 mg via INTRAMUSCULAR

## 2018-05-15 MED ORDER — ONDANSETRON HCL 4 MG PO TABS: ORAL_TABLET | ORAL | 0 refills | Status: DC

## 2018-05-15 MED ORDER — TRAMADOL HCL 50 MG PO TABS: 50.0000 mg | ORAL_TABLET | Freq: Four times a day (QID) | ORAL | 0 refills | Status: DC | PRN

## 2018-05-15 NOTE — ED Triage Notes (Signed)
Patient c/o HA x 2 days. Yesterday was much worse, vomited x 2. Taken IBF without relief.

## 2018-05-15 NOTE — Discharge Instructions (Addendum)
Try to take it easy and rest quietly in a fairly dark area.  Take Zofran (ondansetron) 1 every 6 or 8 hours as needed for nausea  Continue your other medications for now  Take Tylenol (acetaminophen) 500 mg 2 pills every 8 hours as needed for pain.  Do not exceed 6 pills in 24 hours.  In addition to that you can take ibuprofen 200 mg 3 to 4 pills every 8 hours as needed for pain  Take tramadol every 6 or 8 hours as needed for severe headache pain.  This works very well in conjunction with Tylenol.  Take methocarbamol (Robaxin) 500 mg 1 every 6 hours as needed for muscle relaxant for neck.  Return or go elsewhere to get rechecked if needed for acute worsening of the headache.  If headache persists over the next couple of days get rechecked anyhow.  You have received an injection of ketorolac (Toradol) for initial pain relief.

## 2018-05-15 NOTE — ED Provider Notes (Signed)
[facility]    CSN: [id number] Arrival date & time: 05/15/18  0828     History   Chief Complaint Chief Complaint  Patient presents with  . Headache    HPI Shelby Cook is a 32 y.o. female.   HPI Patient has had a headache for the last 3 days.  She does not have a history of migraines or chronic headaches.  She has had occasional little headaches but this is different.  She had an occipital headache, fairly severe.  It caused her vomit.  She had photo phobia.  There was some blurring of vision.  She did not have any other neurologic symptoms.  She had to go home finally.  Yesterday she had a headache all day.  Today she had it again, about a 4 out of 10 at this time.  Nausea has been intermittent.  Photophobia persist.  No gait disturbance or coordination problems.  The only recent change in medication was the adding of phentermine a couple of months ago, and she is lost about 19 pounds and that timeframe.  She is not eating as much.  No other major new stresses in life.  Financial stresses are chronic.  Her husband and her have a good relationship.  Menstrual cycles are regular.  She uses condoms faithfully for contraception. Past Medical History:  Diagnosis Date  . Allergy   . Vaginal Pap smear, abnormal     Patient Active Problem List   Diagnosis Date Noted  . Encounter for smoking cessation counseling 04/03/2018  . Need for rhogam due to Rh negative mother 11/19/2017  . Right knee pain 10/23/2015  . Abnormal weight gain 10/23/2015  . Morbid obesity (HCC) 10/23/2015  . Environmental allergies 08/18/2015  . Seasonal allergies 08/04/2015  . Anxiety and depression 08/04/2015  . Multiple allergies 08/04/2015  . HEADACHE 07/25/2010  . 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 OBESITY NOS 02/16/2007  . ALLERGIC RHINITIS 02/16/2007  . OLIGOMENORRHEA 02/16/2007  . ACNE NEC 02/16/2007    Past Surgical History:  Procedure Laterality  Date  . WISDOM TOOTH EXTRACTION      OB History    Gravida  1   Para      Term      Preterm      AB  1   Living        SAB  1   TAB      Ectopic      Multiple      Live Births               Home Medications    Prior to Admission medications   Medication Sig Start Date End Date Taking? Authorizing Provider  buPROPion (WELLBUTRIN SR) 150 MG 12 hr tablet Take 1 tablet (150 mg total) by mouth 2 (two) times daily. 04/03/18   Breeback, Lonna Cobb, PA-C  diphenhydrAMINE (BENADRYL) 25 MG tablet Take 25 mg by mouth at bedtime.    [provider]  Docosanol (ABREVA) 10 % CREA Apply 1 application topically 5 (five) times daily. Until sore is healed 05/08/18   Lurene Shadow, PA-C  doxycycline (VIBRAMYCIN) 100 MG capsule Take 1 capsule (100 mg total) by mouth 2 (two) times daily. One po bid x 7 days 05/08/18   Lurene Shadow, PA-C  loratadine (CLARITIN) 10 MG tablet Take 10 mg by mouth daily.    [provider]  montelukast (SINGULAIR) 10 MG tablet Take 1 tablet (10 mg total)  by mouth at bedtime. 04/03/18   Jomarie Longs, PA-C  phentermine (ADIPEX-P) 37.5 MG tablet Take 1 tablet (37.5 mg total) by mouth daily before breakfast. 05/04/18   Agapito Games, MD  Prenatal Vit-Fe Fumarate-FA (PRENATAL MULTIVITAMIN) TABS tablet Take 1 tablet by mouth daily at 12 noon.    [provider]    Family History Family History  Problem Relation Age of Onset  . Drug abuse Father   . Mental illness Father        bipolar  . Diabetes Father   . Hypertension Father   . Heart attack Father   . Osteoporosis Mother   . Scoliosis Sister   . Cancer Paternal Grandmother        breast and lung  . Diabetes Paternal Grandfather   . Heart attack Paternal Grandfather   . Allergies Sister     Social History Social History   Tobacco Use  . Smoking status: Former Smoker    Packs/day: 0.50    Types: Cigarettes    Last attempt to quit: 06/11/2015    Years since  quitting: 2.9  . Smokeless tobacco: Never Used  Substance Use Topics  . Alcohol use: Yes    Alcohol/week: 0.0 standard drinks  . Drug use: No     Allergies   Celexa [citalopram hydrobromide] and Flonase [fluticasone propionate]   Review of Systems Review of Systems Constitutional: Headache right now HEENT: Mild visual blurring but not typical aura symptoms.  Still has a little bump on her left lower lip which was checked recently is gradually subsiding I believe Cardiovascular: Unremarkable Respiratory: Unremarkable GI: Had a little nausea, no major pain GU: Unremarkable Musculoskeletal: Unremarkable Neurologic: As discussed above    Physical Exam Triage Vital Signs ED Triage Vitals [05/15/18 0840]  Enc Vitals Group     BP 112/73     Pulse Rate 95     Resp 14     Temp 98.6 F (37 C)     Temp Source Oral     SpO2 98 %     Weight 252 lb (114.3 kg)     Height      Head Circumference      Peak Flow      Pain Score 3     Pain Loc      Pain Edu?      Excl. in GC?    No data found.  Updated Vital Signs BP 112/73 (BP Location: Right Arm)   Pulse 95   Temp 98.6 F (37 C) (Oral)   Resp 14   Wt 114.3 kg   LMP 04/23/2018 (Exact Date)   SpO2 98%   BMI 39.47 kg/m   Visual Acuity Right Eye Distance:   Left Eye Distance:   Bilateral Distance:    Right Eye Near:   Left Eye Near:    Bilateral Near:     Physical Exam Pleasant lady, alert and oriented, obviously does not feel well however.  Oriented to person place and time.  TMs are normal.  Eyes PERRLA.  EOMs intact.  Fundi benign.  Wears glasses.  Throat clear.  Has a little bump on the left lower lip.  Neck supple without significant nodes.  She is tender in the occipital region of her scalp and in the posterior muscles of the neck.  Full range of motion of the neck.  Chest is clear to auscultation.  Heart regular without murmurs, gallops, or arrhythmias.  Abdomen soft.  Extremities unremarkable.  Neurologic  normal with finger-to-nose normal, motor strength good, no palmar drift, Romberg negative, gait and coordination normal.  UC Treatments / Results  Labs (all labs ordered are listed, but only abnormal results are displayed) Labs Reviewed - No data to display  EKG None  Radiology No results found.  Procedures Procedures (including critical care time)  Medications Ordered in UC Medications - No data to display  Initial Impression / Assessment and Plan / UC Course  I have reviewed the triage vital signs and the nursing notes.  Pertinent labs & imaging results that were available during my care of the patient were reviewed by me and considered in my medical decision making (see chart for details).     Migraine with nausea and photophobia Final Clinical Impressions(s) / UC Diagnoses   Final diagnoses:  None   Discharge Instructions   None    ED Prescriptions    None     Controlled Substance Prescriptions [place] Controlled Substance Registry consulted? Yes, I have consulted the [place] Controlled Substances Registry for this patient, and feel the risk/benefit ratio today is favorable for proceeding with this prescription for a controlled substance.   Peyton NajjarHopper, Kendel Pesnell H, MD 05/15/18 [phone]

## 2018-06-03 ENCOUNTER — Ambulatory Visit (INDEPENDENT_AMBULATORY_CARE_PROVIDER_SITE_OTHER): Payer: 59 | Admitting: Physician Assistant

## 2018-06-03 MED ORDER — PHENTERMINE HCL 37.5 MG PO TABS: 37.5000 mg | ORAL_TABLET | Freq: Every day | ORAL | 0 refills | Status: DC

## 2018-06-03 NOTE — Progress Notes (Signed)
   Subjective:    Patient ID: Shelby Cook, female    DOB: 10-19-1985, 32 y.o.   MRN: [id number]  HPI  Shelby Cook is here for blood pressure and weight check. Diet and exercise is going well. Denies trouble sleeping or palpitations.   Review of Systems     Objective:   Physical Exam        Assessment & Plan:  Abnormal weight gain - Patient has lost weight. A refill on phentermine will be sent to pharmacy. Patient advised to schedule a follow up in 4 weeks with provider.    Agree with above plan. Tandy GawJade Breeback PA-C

## 2018-07-06 ENCOUNTER — Ambulatory Visit: Payer: 59 | Admitting: Physician Assistant

## 2018-07-06 ENCOUNTER — Encounter: Payer: Self-pay | Admitting: Physician Assistant

## 2018-07-06 DIAGNOSIS — Z30019 Encounter for initial prescription of contraceptives, unspecified: Secondary | ICD-10-CM | POA: Diagnosis not present

## 2018-07-06 MED ORDER — TOPIRAMATE 25 MG PO TABS: 25.0000 mg | ORAL_TABLET | Freq: Two times a day (BID) | ORAL | 2 refills | Status: DC

## 2018-07-06 MED ORDER — PHENTERMINE HCL 37.5 MG PO TABS: 37.5000 mg | ORAL_TABLET | Freq: Every day | ORAL | 0 refills | Status: DC

## 2018-07-06 MED ORDER — NORETHIN ACE-ETH ESTRAD-FE 1.5-30 MG-MCG PO TABS: 1.0000 | ORAL_TABLET | Freq: Every day | ORAL | 11 refills | Status: DC

## 2018-07-06 NOTE — Progress Notes (Signed)
Subjective:    Patient ID: Shelby Cook, female    DOB: June 12, 1986, 32 y.o.   MRN: [id number]  HPI  Patient is a 32 year old morbidly obese female who presents to the clinic for weight check.  Patient has been on phentermine for 3 months.  She has tolerated this well.  She denies any increase in anxiety, insomnia, palpitations.  She does admit to some dry mouth.  She has increased her exercise to 3-4 times a week.  She is not counting her calories but she is certainly limiting her portion size and certain food choices.  Over the last 3 months she has lost from 266-247.  In the last month she has lost another 5 pounds.  She would like to continue losing this weight.  She would also like a refill on her birth control.  They have decided they do not want to try to get pregnant for another few years.  Her husband is in school.  .. Active Ambulatory Problems    Diagnosis Date Noted  . OBESITY NOS 02/16/2007  . ALLERGIC RHINITIS 02/16/2007  . OLIGOMENORRHEA 02/16/2007  . 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 HEADACHE 07/25/2010  . 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 Seasonal allergies 08/04/2015  . Anxiety and depression 08/04/2015  . Multiple allergies 08/04/2015  . Environmental allergies 08/18/2015  . Right knee pain 10/23/2015  . Abnormal weight gain 10/23/2015  . Morbid obesity (HCC) 10/23/2015  . Need for rhogam due to Rh negative mother 11/19/2017  . Encounter for smoking cessation counseling 04/03/2018   Resolved Ambulatory Problems    Diagnosis Date Noted  . No Resolved Ambulatory Problems   Past Medical History:  Diagnosis Date  . Allergy   . Vaginal Pap smear, abnormal        Review of Systems  All other systems reviewed and are negative.      Objective:   Physical Exam  Constitutional: She is oriented to person, place, and time. She appears well-developed and well-nourished.  HENT:  Head: Normocephalic and atraumatic.  Cardiovascular:  Normal rate and regular rhythm.  Pulmonary/Chest: Effort normal and breath sounds normal.  Neurological: She is alert and oriented to person, place, and time.  Psychiatric: She has a normal mood and affect. Her behavior is normal.          Assessment & Plan:  Marland KitchenMarland KitchenDiagnoses and all orders for this visit:  Morbid obesity (HCC) -     phentermine (ADIPEX-P) 37.5 MG tablet; Take 1 tablet (37.5 mg total) by mouth daily before breakfast. -     topiramate (TOPAMAX) 25 MG tablet; Take 1 tablet (25 mg total) by mouth 2 (two) times daily.  Encounter for female birth control -     norethindrone-ethinyl estradiol-iron (MICROGESTIN FE,GILDESS FE,LOESTRIN FE) 1.5-30 MG-MCG tablet; Take 1 tablet by mouth daily.   Patient has had great success with phentermine.  She has been on it for 3 months.  I would like to continue it for another 3 months.  Refilled today.  I would like to add Topamax low-dose twice a day.  Discussed potential side effects.  We will follow-up in weight in 3 months.  The goal that point would be to transition off phentermine and remain on Topamax for weight management.  We may consider leaving on the low-dose phentermine.  Her goal weight is 200.  Continue to work with diet and exercise.  She is aware not to get pregnant on these weight loss medications.  Sent over  birth control.  Her Pap is due in the spring 2020.  She is aware and will make that appointment accordingly.  I did increase the hormonal doses of her birth control seeing as if she got pregnant on the lower dose earlier this year.  Follow up in 3 months.

## 2018-07-17 ENCOUNTER — Encounter: Payer: Self-pay | Admitting: Physician Assistant

## 2018-08-21 ENCOUNTER — Encounter: Payer: Self-pay | Admitting: Physician Assistant

## 2018-09-18 NOTE — Telephone Encounter (Signed)
Documentation for flu shot has been put in the chart for patient, and patient has been seen since then.

## 2018-09-24 ENCOUNTER — Other Ambulatory Visit: Payer: Self-pay

## 2018-09-24 ENCOUNTER — Emergency Department
Admission: EM | Admit: 2018-09-24 | Discharge: 2018-09-24 | Disposition: A | Discharge status: Home / Self Care | Payer: 59 | Source: Home / Self Care | Attending: Family Medicine | Admitting: Family Medicine

## 2018-09-24 ENCOUNTER — Ambulatory Visit: Payer: 59 | Admitting: Family Medicine

## 2018-09-24 ENCOUNTER — Encounter: Payer: Self-pay | Admitting: Emergency Medicine

## 2018-09-24 DIAGNOSIS — L03115 Cellulitis of right lower limb: Secondary | ICD-10-CM

## 2018-09-24 MED ORDER — DOXYCYCLINE HYCLATE 100 MG PO CAPS: 100.0000 mg | ORAL_CAPSULE | Freq: Two times a day (BID) | ORAL | 0 refills | Status: DC

## 2018-09-24 NOTE — ED Provider Notes (Signed)
[facility]    CSN: [id number] Arrival date & time: 09/24/18  0915     History   Chief Complaint Chief Complaint  Patient presents with  . Cyst    HPI Shelby Cook is a 33 y.o. female.   After running about 5 days ago, patient noticed a sore abrasion on her right proximal medial thigh in inguinal region.  The area has become more painful and erythematous today.  There has been no drainage from the area.  The history is provided by the patient.    Past Medical History:  Diagnosis Date  . Allergy   . Vaginal Pap smear, abnormal     Patient Active Problem List   Diagnosis Date Noted  . Encounter for smoking cessation counseling 04/03/2018  . Need for rhogam due to Rh negative mother 11/19/2017  . Right knee pain 10/23/2015  . Abnormal weight gain 10/23/2015  . Morbid obesity (HCC) 10/23/2015  . Environmental allergies 08/18/2015  . Seasonal allergies 08/04/2015  . Anxiety and depression 08/04/2015  . Multiple allergies 08/04/2015  . HEADACHE 07/25/2010  . 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 OBESITY NOS 02/16/2007  . ALLERGIC RHINITIS 02/16/2007  . OLIGOMENORRHEA 02/16/2007  . ACNE NEC 02/16/2007    Past Surgical History:  Procedure Laterality Date  . WISDOM TOOTH EXTRACTION      OB History    Gravida  1   Para      Term      Preterm      AB  1   Living        SAB  1   TAB      Ectopic      Multiple      Live Births               Home Medications    Prior to Admission medications   Medication Sig Start Date End Date Taking? Authorizing Provider  buPROPion (WELLBUTRIN SR) 150 MG 12 hr tablet Take 1 tablet (150 mg total) by mouth 2 (two) times daily. 04/03/18   Breeback, Lonna CobbJade L, PA-C  diphenhydrAMINE (BENADRYL) 25 MG tablet Take 25 mg by mouth at bedtime.    [provider]  doxycycline (VIBRAMYCIN) 100 MG capsule Take 1 capsule (100 mg total) by mouth 2 (two) times daily. Take  with food. 09/24/18   Lattie HawBeese,  A, MD  loratadine (CLARITIN) 10 MG tablet Take 10 mg by mouth daily.    [provider]  montelukast (SINGULAIR) 10 MG tablet Take 1 tablet (10 mg total) by mouth at bedtime. 04/03/18   Breeback, Lonna CobbJade L, PA-C  norethindrone-ethinyl estradiol-iron (MICROGESTIN FE,GILDESS FE,LOESTRIN FE) 1.5-30 MG-MCG tablet Take 1 tablet by mouth daily. 07/06/18   Jomarie LongsBreeback, Jade L, PA-C  phentermine (ADIPEX-P) 37.5 MG tablet Take 1 tablet (37.5 mg total) by mouth daily before breakfast. 07/06/18   Breeback, Jade L, PA-C  topiramate (TOPAMAX) 25 MG tablet Take 1 tablet (25 mg total) by mouth 2 (two) times daily. 07/06/18   Jomarie LongsBreeback, Jade L, PA-C    Family History Family History  Problem Relation Age of Onset  . Drug abuse Father   . Mental illness Father        bipolar  . Diabetes Father   . Hypertension Father   . Heart attack Father   . Osteoporosis Mother   . Scoliosis Sister   . Cancer Paternal Grandmother        breast and lung  . Diabetes  Paternal Grandfather   . Heart attack Paternal Grandfather   . Allergies Sister     Social History Social History   Tobacco Use  . Smoking status: Former Smoker    Packs/day: 0.50    Types: Cigarettes    Last attempt to quit: 06/11/2015    Years since quitting: 3.2  . Smokeless tobacco: Never Used  Substance Use Topics  . Alcohol use: Yes    Alcohol/week: 0.0 standard drinks  . Drug use: No     Allergies   Celexa [citalopram hydrobromide] and Flonase [fluticasone propionate]   Review of Systems Review of Systems  Constitutional: Negative for activity change, chills, diaphoresis, fatigue and fever.  Skin: Positive for color change and wound.  All other systems reviewed and are negative.    Physical Exam Triage Vital Signs ED Triage Vitals  Enc Vitals Group     BP 09/24/18 0941 108/73     Pulse Rate 09/24/18 0941 96     Resp 09/24/18 0941 16     Temp 09/24/18 0941 98.6 F (37 C)     Temp  Source 09/24/18 0941 Oral     SpO2 09/24/18 0941 100 %     Weight 09/24/18 0942 239 lb (108.4 kg)     Height 09/24/18 0942 5\' 7"  (1.702 m)     Head Circumference --      Peak Flow --      Pain Score 09/24/18 0942 4     Pain Loc --      Pain Edu? --      Excl. in GC? --    No data found.  Updated Vital Signs BP 108/73 (BP Location: Right Arm)   Pulse 96   Temp 98.6 F (37 C) (Oral)   Resp 16   Ht 5\' 7"  (1.702 m)   Wt 108.4 kg   LMP 08/02/2018 (Approximate) Comment: new ocp in november; spotting all december  SpO2 100%   BMI 37.43 kg/m   Visual Acuity Right Eye Distance:   Left Eye Distance:   Bilateral Distance:    Right Eye Near:   Left Eye Near:    Bilateral Near:     Physical Exam Vitals signs and nursing note reviewed. Exam conducted with a chaperone present.  Constitutional:      General: She is not in acute distress.    Appearance: She is obese. She is not diaphoretic.  HENT:     Head: Normocephalic.     Nose: Nose normal.  Eyes:     Pupils: Pupils are equal, round, and reactive to light.  Cardiovascular:     Rate and Rhythm: Normal rate.  Pulmonary:     Effort: Pulmonary effort is normal.  Genitourinary:      Comments: On patient's right medial upper thigh is a 1cm by 1.5 cm erythematous area, tender to palpation, and indurated but not fluctuant. Skin:    General: Skin is warm and dry.  Neurological:     Mental Status: She is alert.      UC Treatments / Results  Labs (all labs ordered are listed, but only abnormal results are displayed) Labs Reviewed - No data to display  EKG None  Radiology No results found.  Procedures Procedures (including critical care time)  Medications Ordered in UC Medications - No data to display  Initial Impression / Assessment and Plan / UC Course  I have reviewed the triage vital signs and the nursing notes.  Pertinent labs & imaging results  that were available during my care of the patient were  reviewed by me and considered in my medical decision making (see chart for details).    Begin doxycycline for staph coverage. Followup with Family Doctor if not improved in one week.    Final Clinical Impressions(s) / UC Diagnoses   Final diagnoses:  Cellulitis of right thigh     Discharge Instructions     Apply warm compress 2 or 3 times daily.  Wear comfortable well fitting underwear.  Return for increasing pain, swelling, redness, etc.    ED Prescriptions    Medication Sig Dispense Auth. Provider   doxycycline (VIBRAMYCIN) 100 MG capsule Take 1 capsule (100 mg total) by mouth 2 (two) times daily. Take with food. 14 capsule Lattie Haw, MD         Lattie Haw, MD 09/24/18 1013

## 2018-09-24 NOTE — Discharge Instructions (Signed)
Apply warm compress 2 or 3 times daily.  Wear comfortable well fitting underwear.  Return for increasing pain, swelling, redness, etc.

## 2018-09-24 NOTE — ED Triage Notes (Signed)
Patient noticed abrasion in right groin probably from clothing rub about 3 days ago; now very red and sore.

## 2018-09-25 ENCOUNTER — Ambulatory Visit: Payer: 59 | Admitting: Physician Assistant

## 2018-09-26 ENCOUNTER — Telehealth: Payer: Self-pay | Admitting: Emergency Medicine

## 2018-09-26 NOTE — Telephone Encounter (Signed)
Patient states that she is doing much better.  Will follow up as needed.

## 2018-10-01 ENCOUNTER — Encounter: Payer: Self-pay | Admitting: Physician Assistant

## 2018-10-07 ENCOUNTER — Encounter: Payer: Self-pay | Admitting: Physician Assistant

## 2018-10-07 ENCOUNTER — Ambulatory Visit: Payer: 59 | Admitting: Physician Assistant

## 2018-10-07 VITALS — BP 130/67 | HR 96 | Ht 67.0 in | Wt 241.0 lb

## 2018-10-07 DIAGNOSIS — F329 Major depressive disorder, single episode, unspecified: Secondary | ICD-10-CM | POA: Diagnosis not present

## 2018-10-07 DIAGNOSIS — E6609 Other obesity due to excess calories: Secondary | ICD-10-CM

## 2018-10-07 DIAGNOSIS — F419 Anxiety disorder, unspecified: Secondary | ICD-10-CM

## 2018-10-07 DIAGNOSIS — Z6837 Body mass index (BMI) 37.0-37.9, adult: Secondary | ICD-10-CM

## 2018-10-07 MED ORDER — VORTIOXETINE HBR 5 MG PO TABS: 5.0000 mg | ORAL_TABLET | Freq: Every day | ORAL | 2 refills | Status: DC

## 2018-10-07 MED ORDER — PHENTERMINE HCL 37.5 MG PO TABS: 37.5000 mg | ORAL_TABLET | Freq: Every day | ORAL | 0 refills | Status: DC

## 2018-10-07 NOTE — Progress Notes (Signed)
Subjective:    Patient ID: Shelby Cook, female    DOB: 1986/07/26, 33 y.o.   MRN: [id number]  HPI  Pt is a 33 yo obese female with anxiety and depression who presents to the clinic for follow up.   Pt is doing great on phentermine. She has been out for a week and  Not noticed too much of a difference. She is working out at [employer] with cardio and weight lifting 25 to 30 minutes 3-4 days a week. She uses my fitness pal to look at calories of food. She has been on phentermine for 3 months.   Her mood seems to have worsened a bit. She has more anxiety. She feels more agitated. wellbutrin helped for a while but she feels like it is just not doing the trick and she really feels that it might be making her worse.  No SI/HC.  Marland Kitchen. Active Ambulatory Problems    Diagnosis Date Noted  . OBESITY NOS 02/16/2007  . ALLERGIC RHINITIS 02/16/2007  . OLIGOMENORRHEA 02/16/2007  . 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 HEADACHE 07/25/2010  . 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 Seasonal allergies 08/04/2015  . Anxiety and depression 08/04/2015  . Multiple allergies 08/04/2015  . Environmental allergies 08/18/2015  . Right knee pain 10/23/2015  . Abnormal weight gain 10/23/2015  . Morbid obesity (HCC) 10/23/2015  . Need for rhogam due to Rh negative mother 11/19/2017  . Encounter for smoking cessation counseling 04/03/2018   Resolved Ambulatory Problems    Diagnosis Date Noted  . No Resolved Ambulatory Problems   Past Medical History:  Diagnosis Date  . Allergy   . Vaginal Pap smear, abnormal      Review of Systems    see HPI.  Objective:   Physical Exam Vitals signs reviewed.  Constitutional:      Appearance: Normal appearance.  HENT:     Head: Normocephalic and atraumatic.  Cardiovascular:     Rate and Rhythm: Normal rate and regular rhythm.  Pulmonary:     Effort: Pulmonary effort is normal.     Breath sounds: Normal breath sounds.  Neurological:   General: No focal deficit present.     Mental Status: She is alert and oriented to person, place, and time.  Psychiatric:        Mood and Affect: Mood normal.        Behavior: Behavior normal.           Assessment & Plan:  Marland KitchenMarland KitchenDrewcilla was seen today for follow-up.  Diagnoses and all orders for this visit:  Anxiety and depression -     vortioxetine HBr (TRINTELLIX) 5 MG TABS tablet; Take 1 tablet (5 mg total) by mouth daily.  Class 2 obesity due to excess calories without serious comorbidity with body mass index (BMI) of 37.0 to 37.9 in adult -     phentermine (ADIPEX-P) 37.5 MG tablet; Take 1 tablet (37.5 mg total) by mouth daily before breakfast.   .. Depression screen [facility] 2/9 10/07/2018 04/03/2018 11/01/2016  Decreased Interest 1 1 2   Down, Depressed, Hopeless 2 1 2   PHQ - 2 Score 3 2 4   Altered sleeping 2 1 2   Tired, decreased energy 1 1 1   Change in appetite 1 1 2   Feeling bad or failure about yourself  1 1 2   Trouble concentrating 0 0 2  Moving slowly or fidgety/restless 0 0 0  Suicidal thoughts 0 0 1  PHQ-9 Score 8 6 14  Difficult doing work/chores Somewhat difficult Somewhat difficult -   .. GAD 7 : Generalized Anxiety Score 10/07/2018 04/03/2018 11/01/2016  Nervous, Anxious, on Edge 2 2 3   Control/stop worrying 2 2 2   Worry too much - different things 1 1 2   Trouble relaxing 1 2 2   Restless 0 1 2  Easily annoyed or irritable 3 2 3   Afraid - awful might happen 3 0 2  Total GAD 7 Score 12 10 16   Anxiety Difficulty Somewhat difficult Somewhat difficult -    Continue on phentermine for now. Start tapering off in the next month or two. Continue to exercise and watch on diet portion and choices. Goal is to lose at least to 200 or under.   Will taper off wellbutrin and start trintellix. Failed celexa in the past. Discussed side effects of trintlelix. Coupon card given. Follow up in 2 months.

## 2018-11-18 ENCOUNTER — Other Ambulatory Visit: Payer: Self-pay

## 2018-11-18 ENCOUNTER — Emergency Department (INDEPENDENT_AMBULATORY_CARE_PROVIDER_SITE_OTHER): Payer: 59

## 2018-11-18 ENCOUNTER — Emergency Department
Admission: EM | Admit: 2018-11-18 | Discharge: 2018-11-18 | Disposition: A | Discharge status: Home / Self Care | Payer: 59 | Source: Home / Self Care | Attending: Emergency Medicine | Admitting: Emergency Medicine

## 2018-11-18 DIAGNOSIS — M778 Other enthesopathies, not elsewhere classified: Secondary | ICD-10-CM | POA: Diagnosis not present

## 2018-11-18 DIAGNOSIS — M25531 Pain in right wrist: Secondary | ICD-10-CM

## 2018-11-18 DIAGNOSIS — G5601 Carpal tunnel syndrome, right upper limb: Secondary | ICD-10-CM

## 2018-11-18 MED ORDER — IBUPROFEN 600 MG PO TABS
600.0000 mg | ORAL_TABLET | Freq: Once | ORAL | Status: AC
  Administered 2018-11-18: 600 mg via ORAL

## 2018-11-18 MED ORDER — PREDNISONE 10 MG (21) PO TBPK: ORAL_TABLET | ORAL | 0 refills | Status: DC

## 2018-11-18 NOTE — ED Provider Notes (Addendum)
[facility]    CSN: [id number] Arrival date & time: 11/18/18  1234     History   Chief Complaint Chief Complaint  Patient presents with  . Wrist Pain    RT    HPI Shelby Cook is a 33 y.o. female.   HPI 5 days of moderate right wrist pain, palmar aspect, can feel sharp and dull, and sometimes a burning sensation that can radiate into the heel of her palm when she tries to pick up or grip things.  She recalls no acute injury, but after further questioning, she recalls doing repetitive motions working outside in her yard 5 or 6 days ago.  She tried using a wrist brace that she had at home, which was a spica wrist splint, and that helped a little but caused too much immobilization of her thumb.  Has tried ibuprofen which did not help significantly. She states that about 1 year ago, she had tendinitis in the right wrist which eventually resolved with time and wearing wrist brace.  She feels this is worse than last year, especially with the burning sensation with movement and gripping.  Denies actual weakness.  Denies elbow or shoulder pain.  No chest pain or shortness of breath.    Past Medical History:  Diagnosis Date  . Allergy   . Vaginal Pap smear, abnormal     Patient Active Problem List   Diagnosis Date Noted  . Encounter for smoking cessation counseling 04/03/2018  . Need for rhogam due to Rh negative mother 11/19/2017  . Right knee pain 10/23/2015  . Abnormal weight gain 10/23/2015  . Morbid obesity (HCC) 10/23/2015  . Environmental allergies 08/18/2015  . Seasonal allergies 08/04/2015  . Anxiety and depression 08/04/2015  . Multiple allergies 08/04/2015  . HEADACHE 07/25/2010  . 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 OBESITY NOS 02/16/2007  . ALLERGIC RHINITIS 02/16/2007  . OLIGOMENORRHEA 02/16/2007  . ACNE NEC 02/16/2007    Past Surgical History:  Procedure Laterality Date  . WISDOM TOOTH EXTRACTION      OB  History    Gravida  1   Para      Term      Preterm      AB  1   Living        SAB  1   TAB      Ectopic      Multiple      Live Births               Home Medications    Prior to Admission medications   Medication Sig Start Date End Date Taking? Authorizing Provider  diphenhydrAMINE (BENADRYL) 25 MG tablet Take 25 mg by mouth at bedtime.    [provider]  loratadine (CLARITIN) 10 MG tablet Take 10 mg by mouth daily.    [provider]  montelukast (SINGULAIR) 10 MG tablet Take 1 tablet (10 mg total) by mouth at bedtime. 04/03/18   Breeback, Lonna Cobb, PA-C  norethindrone-ethinyl estradiol-iron (MICROGESTIN FE,GILDESS FE,LOESTRIN FE) 1.5-30 MG-MCG tablet Take 1 tablet by mouth daily. 07/06/18   Jomarie Longs, PA-C  phentermine (ADIPEX-P) 37.5 MG tablet Take 1 tablet (37.5 mg total) by mouth daily before breakfast. 10/07/18   Breeback, Jade L, PA-C  predniSONE (STERAPRED UNI-PAK 21 TAB) 10 MG (21) TBPK tablet Take tapering dosage over 6 days as directed 11/18/18   Lajean Manes, MD  vortioxetine HBr (TRINTELLIX) 5 MG TABS tablet Take 1 tablet (5  mg total) by mouth daily. 10/07/18   Jomarie Longs, PA-C    Family History Family History  Problem Relation Age of Onset  . Drug abuse Father   . Mental illness Father        bipolar  . Diabetes Father   . Hypertension Father   . Heart attack Father   . Osteoporosis Mother   . Scoliosis Sister   . Cancer Paternal Grandmother        breast and lung  . Diabetes Paternal Grandfather   . Heart attack Paternal Grandfather   . Allergies Sister     Social History Social History   Tobacco Use  . Smoking status: Former Smoker    Packs/day: 0.50    Types: Cigarettes    Last attempt to quit: 06/11/2015    Years since quitting: 3.4  . Smokeless tobacco: Never Used  Substance Use Topics  . Alcohol use: Yes    Alcohol/week: 0.0 standard drinks  . Drug use: No     Allergies   Celexa [citalopram  hydrobromide] and Flonase [fluticasone propionate]   Review of Systems Review of Systems  All other systems reviewed and are negative.    Physical Exam Triage Vital Signs ED Triage Vitals  Enc Vitals Group     BP 11/18/18 1254 124/82     Pulse Rate 11/18/18 1254 91     Resp 11/18/18 1254 16     Temp --      Temp src --      SpO2 11/18/18 1254 100 %     Weight 11/18/18 1255 244 lb (110.7 kg)     Height 11/18/18 1255 5\' 7"  (1.702 m)     Head Circumference --      Peak Flow --      Pain Score 11/18/18 1255 5     Pain Loc --      Pain Edu? --      Excl. in GC? --    No data found.  Updated Vital Signs BP 124/82 (BP Location: Right Arm)   Pulse 91   Resp 16   Ht 5\' 7"  (1.702 m)   Wt 110.7 kg   LMP  (LMP Unknown)   SpO2 100%   BMI 38.22 kg/m   Visual Acuity Right Eye Distance:   Left Eye Distance:   Bilateral Distance:    Right Eye Near:   Left Eye Near:    Bilateral Near:     Physical Exam Vitals signs reviewed.  Constitutional:      General: She is not in acute distress.    Appearance: She is well-developed.  HENT:     Head: Normocephalic and atraumatic.  Eyes:     General: No scleral icterus.    Pupils: Pupils are equal, round, and reactive to light.  Neck:     Musculoskeletal: Normal range of motion and neck supple.  Cardiovascular:     Rate and Rhythm: Normal rate and regular rhythm.  Pulmonary:     Effort: Pulmonary effort is normal.  Abdominal:     General: There is no distension.  Musculoskeletal:     Right shoulder: Normal.     Right elbow: Normal.    Right wrist: She exhibits decreased range of motion and tenderness. She exhibits no swelling, no deformity and no laceration.     Right hand: She exhibits no tenderness, no bony tenderness, normal two-point discrimination, normal capillary refill, no deformity and no swelling. Normal sensation noted. Normal strength  noted.       Hands:     Comments: Pain in right wrist/hand exacerbated by  flexion of wrist.  Skin:    General: Skin is warm and dry.     Capillary Refill: Capillary refill takes less than 2 seconds.     Findings: No rash.  Neurological:     Mental Status: She is alert and oriented to person, place, and time.     Cranial Nerves: No cranial nerve deficit.  Psychiatric:        Behavior: Behavior normal. Behavior is cooperative.      UC Treatments / Results  Labs (all labs ordered are listed, but only abnormal results are displayed) Labs Reviewed - No data to display  EKG None  Radiology Dg Wrist Complete Right  Result Date: 11/18/2018 CLINICAL DATA:  Anterior right wrist pain.  No known injury. EXAM: RIGHT WRIST - COMPLETE 3+ VIEW COMPARISON:  None. FINDINGS: There is no evidence of fracture or dislocation. There is no evidence of arthropathy or other focal bone abnormality. Soft tissues are unremarkable. IMPRESSION: Negative. Electronically Signed   By: Francene Boyers M.D.   On: 11/18/2018 13:38    Procedures Procedures (including critical care time)  Medications Ordered in UC Medications  ibuprofen (ADVIL,MOTRIN) tablet 600 mg (600 mg Oral Given 11/18/18 1414)    Initial Impression / Assessment and Plan / UC Course  I have reviewed the triage vital signs and the nursing notes.  Pertinent labs & imaging results that were available during my care of the patient were reviewed by me and considered in my medical decision making (see chart for details).     X-ray right wrist negative.  Reviewed with patient. Likely has carpal tunnel syndrome and also tendinitis right wrist. After treatment options discussed, we discussed plans, see below for details. Final Clinical Impressions(s) / UC Diagnoses   Final diagnoses:  Tendinitis of right wrist  Carpal tunnel syndrome of right wrist     Discharge Instructions     X-ray right wrist is normal. You have carpal tunnel syndrome right wrist and tendinitis right wrist.  Please see attached instruction  sheets on carpal tunnel syndrome and tendinitis. Right wrist splint applied. Avoid repetitive motions right wrist. Prescription for prednisone sent to your pharmacy. You need to call and make an appointment for follow-up with orthopedist or sports medicine specialist in 7 days. If any severely worsening symptoms, return here or go to emergency room.    ED Prescriptions    Medication Sig Dispense Auth. Provider   predniSONE (STERAPRED UNI-PAK 21 TAB) 10 MG (21) TBPK tablet Take tapering dosage over 6 days as directed 21 tablet Lajean Manes, MD    I wrote a note excusing her from work today.  Advised to avoid repetitive motions right wrist, no lifting over 10 pounds with right hand/wrist for the next 7 days. Precautions discussed. Red flags discussed. Questions invited and answered. Patient voiced understanding and agreement.    Lajean Manes, MD 11/19/18 1056    Lajean Manes, MD 11/19/18 1058

## 2018-11-18 NOTE — Discharge Instructions (Signed)
X-ray right wrist is normal. You have carpal tunnel syndrome right wrist and tendinitis right wrist.  Please see attached instruction sheets on carpal tunnel syndrome and tendinitis. Right wrist splint applied. Avoid repetitive motions right wrist. Prescription for prednisone sent to your pharmacy. You need to call and make an appointment for follow-up with orthopedist or sports medicine specialist in 7 days. If any severely worsening symptoms, return here or go to emergency room.

## 2018-11-18 NOTE — ED Triage Notes (Signed)
Pt c/o RT wrist pain since Friday.  Dx with tendonitis a year ago. Describes as burning sensation in wrist and heel of palm but she tries to pick up/grab things. Pain 5/10

## 2018-11-19 ENCOUNTER — Encounter: Payer: Self-pay | Admitting: Emergency Medicine

## 2018-12-08 ENCOUNTER — Encounter: Payer: Self-pay | Admitting: Physician Assistant

## 2018-12-09 ENCOUNTER — Telehealth: Payer: Self-pay | Admitting: Physician Assistant

## 2018-12-09 ENCOUNTER — Ambulatory Visit: Payer: 59 | Admitting: Physician Assistant

## 2018-12-09 NOTE — Telephone Encounter (Signed)
Encourage to use webex if at all possible.

## 2018-12-09 NOTE — Telephone Encounter (Signed)
Attempted to reach patient to schedule a medication follow up. Left voicemail.

## 2018-12-09 NOTE — Telephone Encounter (Signed)
Shelby Cook, can you please call pt and schedule her with a phone or web appt with Lesly Rubenstein so she can get refills?   Thanks

## 2018-12-20 ENCOUNTER — Other Ambulatory Visit: Payer: Self-pay | Admitting: Physician Assistant

## 2018-12-20 DIAGNOSIS — F419 Anxiety disorder, unspecified: Principal | ICD-10-CM

## 2018-12-20 DIAGNOSIS — F329 Major depressive disorder, single episode, unspecified: Secondary | ICD-10-CM

## 2019-01-05 ENCOUNTER — Encounter: Payer: Self-pay | Admitting: Physician Assistant

## 2019-01-06 NOTE — Telephone Encounter (Signed)
Can we call pharmacy and confirm medication was discontinued. I have not heard of this. Maybe it is not is stock?

## 2019-01-08 NOTE — Telephone Encounter (Signed)
Pharmacy just received the Singluair. They will reach out to the patient

## 2019-01-22 ENCOUNTER — Other Ambulatory Visit: Payer: Self-pay | Admitting: Physician Assistant

## 2019-01-22 DIAGNOSIS — F329 Major depressive disorder, single episode, unspecified: Secondary | ICD-10-CM

## 2019-01-22 DIAGNOSIS — F419 Anxiety disorder, unspecified: Secondary | ICD-10-CM

## 2019-02-20 ENCOUNTER — Other Ambulatory Visit: Payer: Self-pay | Admitting: Physician Assistant

## 2019-02-20 DIAGNOSIS — F329 Major depressive disorder, single episode, unspecified: Secondary | ICD-10-CM

## 2019-02-20 DIAGNOSIS — F419 Anxiety disorder, unspecified: Secondary | ICD-10-CM

## 2019-03-12 ENCOUNTER — Other Ambulatory Visit: Payer: Self-pay | Admitting: Physician Assistant

## 2019-03-12 DIAGNOSIS — F329 Major depressive disorder, single episode, unspecified: Secondary | ICD-10-CM

## 2019-03-12 DIAGNOSIS — F419 Anxiety disorder, unspecified: Secondary | ICD-10-CM

## 2019-03-18 ENCOUNTER — Encounter: Payer: Self-pay | Admitting: Family Medicine

## 2019-03-18 ENCOUNTER — Ambulatory Visit (INDEPENDENT_AMBULATORY_CARE_PROVIDER_SITE_OTHER): Payer: 59 | Admitting: Family Medicine

## 2019-03-18 ENCOUNTER — Ambulatory Visit (INDEPENDENT_AMBULATORY_CARE_PROVIDER_SITE_OTHER): Payer: 59

## 2019-03-18 ENCOUNTER — Other Ambulatory Visit: Payer: Self-pay

## 2019-03-18 VITALS — BP 127/67 | HR 89 | Temp 98.3°F | Wt 257.0 lb

## 2019-03-18 DIAGNOSIS — M79672 Pain in left foot: Secondary | ICD-10-CM

## 2019-03-18 MED ORDER — DICLOFENAC SODIUM 1 % TD GEL: 4.0000 g | Freq: Four times a day (QID) | TRANSDERMAL | 11 refills | Status: DC

## 2019-03-18 NOTE — Progress Notes (Signed)
Shelby Cook is a 33 y.o. female who [facility] today for left foot pain x 2 days.  Patient notes dorsal foot pain and swelling.  Pain started about 2 days ago.  She notes pain is worse with activity and following activity.  Pain does improve after rest.  Walking does worsen her pain.  She denies any injury.  She notes that she started a running program last week.  She is tried some over-the-counter medications for pain which is helped a little.  She denies any fevers or chills nausea vomiting or diarrhea.     ROS:  As above  Exam:  BP 127/67   Pulse 89   Temp 98.3 F (36.8 C) (Oral)   Wt 257 lb (116.6 kg)   BMI 40.25 kg/m  Wt Readings from Last 5 Encounters:  03/18/19 257 lb (116.6 kg)  11/18/18 244 lb (110.7 kg)  10/07/18 241 lb (109.3 kg)  09/24/18 239 lb (108.4 kg)  07/06/18 247 lb (112 kg)   General: Well Developed, well nourished, and in no acute distress.  Neuro/Psych: Alert and oriented x3, extra-ocular muscles intact, able to move all 4 extremities, sensation grossly intact. Skin: Warm and dry, no rashes noted.  Respiratory: Not using accessory muscles, speaking in full sentences, trachea midline.  Cardiovascular: Pulses palpable, no extremity edema. Abdomen: Does not appear distended. MSK:  Left foot: Slightly swollen appearing no deformities or erythema or bruising. Normal motion. Tender palpation to second and third proximal dorsal metatarsal. Pulses cap refill and sensation intact distally.    Lab and Radiology Results X-ray images left foot personally and independently reviewed. No obvious fracture or deformity.  No malalignment. Await formal radiology over read    Assessment and Plan: 33 y.o. female with left dorsal foot pain and swelling.  Concerning for stress fracture or stress reaction.  Patient had increased level of activity.  Plan for relative rest.  Treat with immobilization in a  postop shoe.  Diclofenac gel topically as needed.  Recheck in about 2 weeks.  Likely can follow-up with video visit if doing better.  Await formal radiology overread.   PDMP not reviewed this encounter. Orders Placed This Encounter  Procedures  . DG Foot Complete Left    Standing Status:   Future    Number of Occurrences:   1    Standing Expiration Date:   05/18/2020    Order Specific Question:   Reason for Exam (SYMPTOM  OR DIAGNOSIS REQUIRED)    Answer:   eval midfoot pain    Order Specific Question:   Is patient pregnant?    Answer:   No    Order Specific Question:   Preferred imaging location?    Answer:   [facility]    Order Specific Question:   Radiology Contrast Protocol - do NOT remove file path    Answer:   [email or web address]   Meds ordered this encounter  Medications  . diclofenac sodium (VOLTAREN) 1 % GEL    Sig: Apply 4 g topically 4 (four) times daily. To affected joint.    Dispense:  100 g    Refill:  11    Historical information moved to improve visibility of documentation.  Past Medical History:  Diagnosis Date  . Allergy   . Vaginal Pap smear, abnormal    Past Surgical History:  Procedure Laterality Date  . WISDOM TOOTH EXTRACTION     Social History   Tobacco Use  .  Smoking status: Former Smoker    Packs/day: 0.50    Types: Cigarettes    Quit date: 06/11/2015    Years since quitting: 3.7  . Smokeless tobacco: Never Used  Substance Use Topics  . Alcohol use: Yes    Alcohol/week: 0.0 standard drinks   family history includes Allergies in her sister; Cancer in her paternal grandmother; Diabetes in her father and paternal grandfather; Drug abuse in her father; Heart attack in her father and paternal grandfather; Hypertension in her father; Mental illness in her father; Osteoporosis in her mother; Scoliosis in her sister.  Medications: Current Outpatient Medications  Medication Sig Dispense Refill  .  diclofenac sodium (VOLTAREN) 1 % GEL Apply 4 g topically 4 (four) times daily. To affected joint. 100 g 11  . diphenhydrAMINE (BENADRYL) 25 MG tablet Take 25 mg by mouth at bedtime.    Marland Kitchen loratadine (CLARITIN) 10 MG tablet Take 10 mg by mouth daily.    . montelukast (SINGULAIR) 10 MG tablet Take 1 tablet (10 mg total) by mouth at bedtime. 90 tablet 4  . norethindrone-ethinyl estradiol-iron (MICROGESTIN FE,GILDESS FE,LOESTRIN FE) 1.5-30 MG-MCG tablet Take 1 tablet by mouth daily. 1 Package 11  . phentermine (ADIPEX-P) 37.5 MG tablet Take 1 tablet (37.5 mg total) by mouth daily before breakfast. 90 tablet 0  . predniSONE (STERAPRED UNI-PAK 21 TAB) 10 MG (21) TBPK tablet Take tapering dosage over 6 days as directed 21 tablet 0  . TRINTELLIX 5 MG TABS tablet TAKE 1 TABLET (5 MG TOTAL) BY MOUTH DAILY. NEEDS APPT FOR FURTHER REFILLS 7 tablet 0   No current facility-administered medications for this visit.    Allergies  Allergen Reactions  . Celexa [Citalopram Hydrobromide] Hives  . Flonase [Fluticasone Propionate]     Nose bleeds       Discussed warning signs or symptoms. Please see discharge instructions. Patient expresses understanding.

## 2019-03-18 NOTE — Patient Instructions (Addendum)
Thank you for coming in today. Use the post op shoe.  Apply diclofenac gel as needed up to 4x daily.  Recheck in 2 weeks.  Return sooner if needed.  If doing pretty well in 2 weeks we may try to make the visit a video visit.    Stress Fracture  A stress fracture is a small break or crack in a bone. A stress fracture can be fully broken (complete) or partially broken (incomplete). The most common sites for stress fractures are the bones in the front of your feet (metatarsals), your heel (calcaneus), and the long bone of your lower leg (tibia). What are the causes? This condition is caused by overuse or repetitive exercise, such as running. It happens when a bone cannot absorb any more shock because the muscles around it are weak. Stress fractures happen most commonly when:  You rapidly increase or start a new physical activity.  You use shoes that are worn out or do not fit properly.  You exercise on a new surface. What increases the risk? You are more likely to develop this condition if:  You have a condition that causes weak bones (osteoporosis).  You are female. Stress fractures are more likely to occur in women. What are the signs or symptoms? The most common symptom of a stress fracture is feeling pain when you are using or putting weight on the affected part of your body. The pain usually improves when you are resting. Other symptoms may include:  Swelling of the affected area.  Pain in the area when it is touched. Stress fracture pain usually develops over time. How is this diagnosed? This condition may be diagnosed by:  Your symptoms.  Your medical history.  A physical exam.  Imaging tests, such as: ? X-rays. ? MRI. ? Bone scan. How is this treated? Treatment depends on the severity of your stress fracture. It is commonly treated with resting, icing, compression, and elevation (RICE therapy). Treatment may also include:  Medicines to reduce inflammation.  A  cast or a walking shoe.  Crutches.  Surgery. This is usually only in severe cases. Follow these instructions at home: If you have a cast:  Do not put pressure on any part of the cast until it is fully hardened. This may take several hours.  Do not stick anything inside the cast to scratch your skin. Doing that increases your risk of infection.  Check the skin around the cast every day. Tell your health care provider about any concerns.  You may put lotion on dry skin around the edges of the cast. Do not put lotion on the skin underneath the cast.  Keep the cast clean.  If the cast is not waterproof: ? Do not let it get wet. ? Cover it with a watertight covering when you take a bath or a shower. If you have a walking shoe:  Wear the shoe as told by your health care provider. Remove it only as told by your health care provider.  Loosen the shoe if your toes tingle, become numb, or turn cold and blue.  Keep the shoe clean.  If the shoe is not waterproof: ? Do not let it get wet. Managing pain, stiffness, and swelling   If directed, apply ice to the injured area: ? If you have a walking shoe, remove the shoe as told by your health care provider. ? Put ice in a plastic bag. ? Place a towel between your skin and the bag or  between your cast and the bag. ? Leave the ice on for 20 minutes, 2-3 times per day.  Move your toes often to avoid stiffness and to lessen swelling.  Raise (elevate) the injured area above the level of your heart while you are sitting or lying down. Activity  Rest as directed by your health care provider. Ask your health care provider if you may do alternative exercises, such as swimming or biking, while you are healing.  Return to your normal activities as directed by your health care provider. Ask your health care provider what activities are safe for you.  Perform range-of-motion exercises only as directed by your health care provider. General  instructions  Do not use the injured limb to support yourbody weight until your health care provider says that you can. Use crutches if your health care provider tells you to do so.  Do not use any products that contain nicotine or tobacco, such as cigarettes and e-cigarettes. These can delay bone healing. If you need help quitting, ask your health care provider.  Take over-the-counter and prescription medicines only as told by your health care provider.  Keep all follow-up visits as told by your health care provider. This is important. How is this prevented?  Only wear shoes that: ? Fit well. ? Are not worn out.  Eat a healthy diet that contains vitamin D and calcium. This helps keep your bones strong. Good sources of calcium and vitamin D include: ? Low-fat dairy products such as milk, yogurt, and cheese. ? Certain fish, such as fresh or canned salmon, tuna, and sardines. ? Products that have calcium and vitamin D added to them (fortified products), such as fortified cereals or juice.  Be careful when you start a new physical activity. Give your body time to adjust.  Avoid doing only one kind of activity. Do different exercises, such as swimming and running, so that no single part of your body gets overused.  Do strength-training exercises. Contact a health care provider if:  Your pain gets worse.  You have new symptoms.  You have increased swelling. Get help right away if:  You lose feeling in the injured area. Summary  A stress fracture is a small break or crack in a bone. A stress fracture can be fully broken (complete) or partially broken (incomplete).  This condition is caused by overuse or repetitive exercise, such as running.  The most common symptom of a stress fracture is feeling pain when you are using or putting weight on the affected part of your body.  Treatment depends on the severity of your stress fracture. This information is not intended to replace  advice given to you by your health care provider. Make sure you discuss any questions you have with your health care provider. Document Released: 11/23/2002 Document Revised: 10/14/2017 Document Reviewed: 10/14/2017 Elsevier Patient Education  2020 [employer]

## 2019-03-29 ENCOUNTER — Encounter: Payer: Self-pay | Admitting: Physician Assistant

## 2019-03-29 ENCOUNTER — Other Ambulatory Visit: Payer: Self-pay

## 2019-03-29 ENCOUNTER — Ambulatory Visit: Payer: 59 | Admitting: Physician Assistant

## 2019-03-29 ENCOUNTER — Ambulatory Visit (INDEPENDENT_AMBULATORY_CARE_PROVIDER_SITE_OTHER): Payer: 59 | Admitting: Physician Assistant

## 2019-03-29 VITALS — BP 127/68 | HR 86 | Temp 98.6°F | Ht 67.0 in | Wt 256.0 lb

## 2019-03-29 DIAGNOSIS — F419 Anxiety disorder, unspecified: Secondary | ICD-10-CM

## 2019-03-29 DIAGNOSIS — H60331 Swimmer's ear, right ear: Secondary | ICD-10-CM | POA: Diagnosis not present

## 2019-03-29 DIAGNOSIS — Z889 Allergy status to unspecified drugs, medicaments and biological substances status: Secondary | ICD-10-CM

## 2019-03-29 DIAGNOSIS — Z9109 Other allergy status, other than to drugs and biological substances: Secondary | ICD-10-CM | POA: Diagnosis not present

## 2019-03-29 DIAGNOSIS — F329 Major depressive disorder, single episode, unspecified: Secondary | ICD-10-CM

## 2019-03-29 MED ORDER — VORTIOXETINE HBR 5 MG PO TABS: ORAL_TABLET | ORAL | 4 refills | Status: DC

## 2019-03-29 MED ORDER — MONTELUKAST SODIUM 10 MG PO TABS: 10.0000 mg | ORAL_TABLET | Freq: Every day | ORAL | 4 refills | Status: DC

## 2019-03-29 MED ORDER — OFLOXACIN 0.3 % OT SOLN: 10.0000 [drp] | Freq: Every day | OTIC | 0 refills | Status: DC

## 2019-03-29 NOTE — Progress Notes (Signed)
Subjective:    Patient ID: Shelby Cook, female    DOB: 03-17-1986, 33 y.o.   MRN: [id number]  HPI  Pt is a 33 yo female with anxiety and depression who present to the clinic for follow up and medication refill.   She has been doing great with trintellix. She ran out 3 days ago and can really tell a difference. She denies any SI/HC. She feels like mood is much better. No problems or concerns.   She does need singular refill for allergies.   She does mention right ear pain for last 2 days. Intermittent pain. No fever, chills, sob, cough. Not tried anything to make better. She admits to swimming.   .. Active Ambulatory Problems    Diagnosis Date Noted  . OBESITY NOS 02/16/2007  . ALLERGIC RHINITIS 02/16/2007  . OLIGOMENORRHEA 02/16/2007  . 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 HEADACHE 07/25/2010  . 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 Seasonal allergies 08/04/2015  . Anxiety and depression 08/04/2015  . Multiple allergies 08/04/2015  . Environmental allergies 08/18/2015  . Right knee pain 10/23/2015  . Abnormal weight gain 10/23/2015  . Morbid obesity ([place]) 10/23/2015  . Need for rhogam due to Rh negative mother 11/19/2017  . Encounter for smoking cessation counseling 04/03/2018   Resolved Ambulatory Problems    Diagnosis Date Noted  . No Resolved Ambulatory Problems   Past Medical History:  Diagnosis Date  . Allergy   . Vaginal Pap smear, abnormal      Review of Systems See HPI.     Objective:   Physical Exam Vitals signs reviewed.  Constitutional:      Appearance: Normal appearance. She is obese.  HENT:     Head: Normocephalic.     Right Ear: Tympanic membrane normal.     Left Ear: Tympanic membrane, ear canal and external ear normal.     Ears:     Comments: Right external canal erythematous with tenderness over tragus to palpation.     Mouth/Throat:     Mouth: Mucous membranes are moist.  Cardiovascular:     Rate and Rhythm: Normal  rate and regular rhythm.  Pulmonary:     Effort: Pulmonary effort is normal.     Breath sounds: Normal breath sounds.  Neurological:     General: No focal deficit present.     Mental Status: She is alert.  Psychiatric:        Mood and Affect: Mood normal.        Behavior: Behavior normal.           Assessment & Plan:  Marland KitchenMarland KitchenElianny was seen today for anxiety and depression.  Diagnoses and all orders for this visit:  Anxiety and depression -     vortioxetine HBr (TRINTELLIX) 5 MG TABS tablet; TAKE 1 TABLET (5 MG TOTAL) BY MOUTH DAILY.  Multiple allergies -     montelukast (SINGULAIR) 10 MG tablet; Take 1 tablet (10 mg total) by mouth at bedtime.  Environmental allergies -     montelukast (SINGULAIR) 10 MG tablet; Take 1 tablet (10 mg total) by mouth at bedtime.  Acute swimmer's ear of right side -     ofloxacin (FLOXIN) 0.3 % OTIC solution; Place 10 drops into the right ear daily. For 7 days.    Depression screen [facility] 2/9 03/29/2019 10/07/2018 04/03/2018 11/01/2016  Decreased Interest 0 1 1 2   Down, Depressed, Hopeless 1 2 1 2   PHQ - 2 Score 1 3 2  4  Altered sleeping 1 2 1 2   Tired, decreased energy 1 1 1 1   Change in appetite 2 1 1 2   Feeling bad or failure about yourself  1 1 1 2   Trouble concentrating 0 0 0 2  Moving slowly or fidgety/restless 0 0 0 0  Suicidal thoughts 0 0 0 1  PHQ-9 Score 6 8 6 14   Difficult doing work/chores Somewhat difficult Somewhat difficult Somewhat difficult -   .. GAD 7 : Generalized Anxiety Score 03/29/2019 10/07/2018 04/03/2018 11/01/2016  Nervous, Anxious, on Edge 1 2 2 3   Control/stop worrying 1 2 2 2   Worry too much - different things 1 1 1 2   Trouble relaxing 1 1 2 2   Restless 0 0 1 2  Easily annoyed or irritable 1 3 2 3   Afraid - awful might happen 1 3 0 2  Total GAD 7 Score 6 12 10 16   Anxiety Difficulty Somewhat difficult Somewhat difficult Somewhat difficult -    Refilled trintellix.   Sent abx drops for infection.   Needs  cpe/pap and labs. Will schedule.

## 2019-04-01 ENCOUNTER — Encounter: Payer: Self-pay | Admitting: Family Medicine

## 2019-04-01 ENCOUNTER — Ambulatory Visit (INDEPENDENT_AMBULATORY_CARE_PROVIDER_SITE_OTHER): Payer: 59 | Admitting: Family Medicine

## 2019-04-01 ENCOUNTER — Other Ambulatory Visit: Payer: Self-pay

## 2019-04-01 DIAGNOSIS — M79672 Pain in left foot: Secondary | ICD-10-CM

## 2019-04-01 NOTE — Patient Instructions (Signed)
Thank you for coming in today.  Please continue the postop shoe for 2 to 4 weeks.  Okay to start weaning into a sneaker if able. In 4 weeks if not a lot better we should recheck either virtually or in person. Once all better in about 4 weeks okay to start slowly advancing activity.  Plan for about restarting at 50% preinjury level and advancing 10 %/week.  If you want to start wearing more regular shoes you can use a turf toe insole inside the shoe that may help some as well.  Get a Steel Turf Toe insole.  Do a Producer, television/film/video for [employer]

## 2019-04-01 NOTE — Progress Notes (Signed)
Virtual Visit  via Video Note  I connected with      Shelby Cook by a video enabled telemedicine application and verified that I am speaking with the correct person using two identifiers.   I discussed the limitations of evaluation and management by telemedicine and the availability of in person appointments. The patient expressed understanding and agreed to proceed.  History of Present Illness: Shelby Cook is a 33 y.o. female who would like to discuss follow-up left foot pain and swelling.  Patient was seen on July 2.  At that time she was thought to perhaps have a stress fracture or stress reaction.  X-rays did not show any significant changes in her foot.  After discussion plan to treat with immobilization with postop shoe and use diclofenac gel as needed.   In the interim Jourden notes she is doing quite well.  She notes her pain has improved quite a bit.  She has a little bit of pain along the lateral foot but overall is very happy with how things are going.  She even tried walking around without her postop shoe a little bit and although her pain did get a little worse with that was better than it had been.  She is happy with how things are going.    Observations/Objective: There were no vitals taken for this visit. Wt Readings from Last 5 Encounters:  03/29/19 256 lb (116.1 kg)  03/18/19 257 lb (116.6 kg)  11/18/18 244 lb (110.7 kg)  10/07/18 241 lb (109.3 kg)  09/24/18 239 lb (108.4 kg)   Exam: Appearance nontoxic no acute distress Normal Speech.    Lab and Radiology Results No results found for this or any previous visit (from the past 72 hour(s)). No results found.   Assessment and Plan: 33 y.o. female with foot pain.  Improving stress reaction or stress fracture likely.  Plan to continue postop shoe.  Wean to regular shoe if able in the next 4 weeks.  Consider turf toe and cell.  If doing well no need for recheck however will reassess in 2 to 4 weeks  more like 4 weeks if not doing well.  Discussed return to activity as well with starting a 50% preinjury level and advancing 10 %/week.   PDMP not reviewed this encounter. No orders of the defined types were placed in this encounter.  No orders of the defined types were placed in this encounter.   Follow Up Instructions:    I discussed the assessment and treatment plan with the patient. The patient was provided an opportunity to ask questions and all were answered. The patient agreed with the plan and demonstrated an understanding of the instructions.   The patient was advised to call back or seek an in-person evaluation if the symptoms worsen or if the condition fails to improve as anticipated.  Time: 15 minutes of intraservice time, with >22 minutes of total time during today's visit.      Historical information moved to improve visibility of documentation.  Past Medical History:  Diagnosis Date  . Allergy   . Vaginal Pap smear, abnormal    Past Surgical History:  Procedure Laterality Date  . WISDOM TOOTH EXTRACTION     Social History   Tobacco Use  . Smoking status: Former Smoker    Packs/day: 0.50    Types: Cigarettes    Quit date: 06/11/2015    Years since quitting: 3.8  . Smokeless tobacco: Never Used  Substance Use Topics  .  Alcohol use: Yes    Alcohol/week: 0.0 standard drinks   family history includes Allergies in her sister; Cancer in her paternal grandmother; Diabetes in her father and paternal grandfather; Drug abuse in her father; Heart attack in her father and paternal grandfather; Hypertension in her father; Mental illness in her father; Osteoporosis in her mother; Scoliosis in her sister.  Medications: Current Outpatient Medications  Medication Sig Dispense Refill  . diclofenac sodium (VOLTAREN) 1 % GEL Apply 4 g topically 4 (four) times daily. To affected joint. 100 g 11  . diphenhydrAMINE (BENADRYL) 25 MG tablet Take 25 mg by mouth at bedtime.    Marland Kitchen.  loratadine (CLARITIN) 10 MG tablet Take 10 mg by mouth daily.    . montelukast (SINGULAIR) 10 MG tablet Take 1 tablet (10 mg total) by mouth at bedtime. 90 tablet 4  . norethindrone-ethinyl estradiol-iron (MICROGESTIN FE,GILDESS FE,LOESTRIN FE) 1.5-30 MG-MCG tablet Take 1 tablet by mouth daily. 1 Package 11  . ofloxacin (FLOXIN) 0.3 % OTIC solution Place 10 drops into the right ear daily. For 7 days. 10 mL 0  . vortioxetine HBr (TRINTELLIX) 5 MG TABS tablet TAKE 1 TABLET (5 MG TOTAL) BY MOUTH DAILY. 90 tablet 4   No current facility-administered medications for this visit.    Allergies  Allergen Reactions  . Celexa [Citalopram Hydrobromide] Hives  . Flonase [Fluticasone Propionate]     Nose bleeds

## 2019-04-19 ENCOUNTER — Other Ambulatory Visit (HOSPITAL_COMMUNITY)
Admission: RE | Admit: 2019-04-19 | Discharge: 2019-04-19 | Disposition: A | Discharge status: Home / Self Care | Payer: 59 | Source: Ambulatory Visit | Attending: Physician Assistant | Admitting: Physician Assistant

## 2019-04-19 ENCOUNTER — Ambulatory Visit (INDEPENDENT_AMBULATORY_CARE_PROVIDER_SITE_OTHER): Payer: 59 | Admitting: Physician Assistant

## 2019-04-19 ENCOUNTER — Encounter: Payer: Self-pay | Admitting: Physician Assistant

## 2019-04-19 ENCOUNTER — Other Ambulatory Visit: Payer: Self-pay

## 2019-04-19 VITALS — BP 119/74 | HR 83 | Ht 67.0 in | Wt 260.0 lb

## 2019-04-19 DIAGNOSIS — Z30019 Encounter for initial prescription of contraceptives, unspecified: Secondary | ICD-10-CM

## 2019-04-19 DIAGNOSIS — Z13220 Encounter for screening for lipoid disorders: Secondary | ICD-10-CM | POA: Diagnosis not present

## 2019-04-19 DIAGNOSIS — R5383 Other fatigue: Secondary | ICD-10-CM

## 2019-04-19 DIAGNOSIS — Z131 Encounter for screening for diabetes mellitus: Secondary | ICD-10-CM | POA: Diagnosis not present

## 2019-04-19 DIAGNOSIS — Z0000 Encounter for general adult medical examination without abnormal findings: Secondary | ICD-10-CM | POA: Diagnosis present

## 2019-04-19 DIAGNOSIS — Z8742 Personal history of other diseases of the female genital tract: Secondary | ICD-10-CM

## 2019-04-19 MED ORDER — NORETHIN ACE-ETH ESTRAD-FE 1.5-30 MG-MCG PO TABS: 1.0000 | ORAL_TABLET | Freq: Every day | ORAL | 11 refills | Status: DC

## 2019-04-19 NOTE — Progress Notes (Signed)
Subjective:     Shelby Cook is a 33 y.o. female and is here for a comprehensive physical exam. The patient reports problems - only with energy. she admits she stopped her MVI. she wakes up feeling not rested. she denies any depression or anxiety.    Pt does exercise. She is biking and walking 3-4 times a week.   Hx of abnormal pap LGSIL last pap 2017 no abnormal cells. No abnormal bleeding or issues.    Social History   Socioeconomic History  . Marital status: Married    Spouse name: Not on file  . Number of children: 0  . Years of education: Not on file  . Highest education level: Not on file  Occupational History    Employer: [employer]  Social Needs  . Financial resource strain: Not on file  . Food insecurity    Worry: Not on file    Inability: Not on file  . Transportation needs    Medical: Not on file    Non-medical: Not on file  Tobacco Use  . Smoking status: Former Smoker    Packs/day: 0.50    Types: Cigarettes    Quit date: 06/11/2015    Years since quitting: 3.8  . Smokeless tobacco: Never Used  Substance and Sexual Activity  . Alcohol use: Yes    Alcohol/week: 0.0 standard drinks  . Drug use: No  . Sexual activity: Yes    Birth control/protection: None  Lifestyle  . Physical activity    Days per week: Not on file    Minutes per session: Not on file  . Stress: Not on file  Relationships  . Social Musicianconnections    Talks on phone: Not on file    Gets together: Not on file    Attends religious service: Not on file    Active member of club or organization: Not on file    Attends meetings of clubs or organizations: Not on file    Relationship status: Not on file  . Intimate partner violence    Fear of current or ex partner: Not on file    Emotionally abused: Not on file    Physically abused: Not on file    Forced sexual activity: Not on file  Other Topics Concern  . Not on file  Social History Narrative   Regular exercise yes, joining [employer]   Diet:  Avoiding sugar,fruits and veggies   Single 6 month relationship no sex   Health Maintenance  Topic Date Due  . PAP SMEAR-Modifier  10/22/2018  . INFLUENZA VACCINE  04/17/2019  . TETANUS/TDAP  05/25/2024  . HIV Screening  Completed    The following portions of the patient's history were reviewed and updated as appropriate: allergies, current medications, past family history, past medical history, past social history, past surgical history and problem list.  Review of Systems Pertinent items noted in HPI and remainder of comprehensive ROS otherwise negative.   Objective:    BP 119/74   Pulse 83   Ht 5\' 7"  (1.702 m)   Wt 260 lb (117.9 kg)   LMP 03/19/2019   SpO2 100% Comment: on RA  BMI 40.72 kg/m  General appearance: alert, cooperative, appears stated age and morbidly obese Head: Normocephalic, without obvious abnormality, atraumatic Eyes: conjunctivae/corneas clear. PERRL, EOM's intact. Fundi benign. Ears: normal TM's and external ear canals both ears Nose: Nares normal. Septum midline. Mucosa normal. No drainage or sinus tenderness. Throat: lips, mucosa, and tongue normal; teeth and gums normal  Neck: no adenopathy, no carotid bruit, no JVD, supple, symmetrical, trachea midline and thyroid not enlarged, symmetric, no tenderness/mass/nodules Back: symmetric, no curvature. ROM normal. No CVA tenderness. Lungs: clear to auscultation bilaterally Breasts: normal appearance, no masses or tenderness Heart: regular rate and rhythm, S1, S2 normal, no murmur, click, rub or gallop Abdomen: soft, non-tender; bowel sounds normal; no masses,  no organomegaly Pelvic: external genitalia normal, no cervical motion tenderness, uterus normal size, shape, and consistency, vagina normal without discharge and some tenderness with bimanuel exam bilaterall. cervix appeared friable with no masses/polyps.  Extremities: extremities normal, atraumatic, no cyanosis or edema Pulses: 2+ and symmetric Skin:  Skin color, texture, turgor normal. No rashes or lesions Lymph nodes: Cervical, supraclavicular, and axillary nodes normal. Neurologic: Alert and oriented X 3, normal strength and tone. Normal symmetric reflexes. Normal coordination and gait    Assessment:    Healthy female exam.      Plan:    Marland Kitchen.Marland Kitchen.Shelby Cook was seen today for gynecologic exam.  Diagnoses and all orders for this visit:  Routine physical examination -     Cytology - PAP  No energy -     Lipid Panel w/reflex Direct LDL -     COMPLETE METABOLIC PANEL WITH GFR -     [id number] and Folate Panel -     Vitamin D 1,25 dihydroxy -     TSH -     CBC with Differential/Platelet -     Ferritin  Screening for diabetes mellitus -     COMPLETE METABOLIC PANEL WITH GFR  Screening for lipid disorders -     Lipid Panel w/reflex Direct LDL  History of abnormal cervical Pap smear  Encounter for female birth control -     norethindrone-ethinyl estradiol-iron (LOESTRIN FE) 1.5-30 MG-MCG tablet; Take 1 tablet by mouth daily.  Morbid obesity ([facility])   .Marland Kitchen. Depression screen [employer] 2/9 03/29/2019 10/07/2018 04/03/2018 11/01/2016  Decreased Interest 0 1 1 2   Down, Depressed, Hopeless 1 2 1 2   PHQ - 2 Score 1 3 2 4   Altered sleeping 1 2 1 2   Tired, decreased energy 1 1 1 1   Change in appetite 2 1 1 2   Feeling bad or failure about yourself  1 1 1 2   Trouble concentrating 0 0 0 2  Moving slowly or fidgety/restless 0 0 0 0  Suicidal thoughts 0 0 0 1  PHQ-9 Score 6 8 6 14   Difficult doing work/chores Somewhat difficult Somewhat difficult Somewhat difficult -   .. GAD 7 : Generalized Anxiety Score 03/29/2019 10/07/2018 04/03/2018 11/01/2016  Nervous, Anxious, on Edge 1 2 2 3   Control/stop worrying 1 2 2 2   Worry too much - different things 1 1 1 2   Trouble relaxing 1 1 2 2   Restless 0 0 1 2  Easily annoyed or irritable 1 3 2 3   Afraid - awful might happen 1 3 0 2  Total GAD 7 Score 6 12 10 16   Anxiety Difficulty Somewhat difficult Somewhat  difficult Somewhat difficult -    Discussed 150 minutes of exercise a week.  Encouraged vitamin D 1000 units and Calcium 1300mg  or 4 servings of dairy a day.  Pap done today. Declined STDs. Hx of abnormal pap.  Fasting labs ordered.   Marland Kitchen..Discussed low carb diet with 1500 calories and 80g of protein.  Exercising at least 150 minutes a week.  My Fitness Pal could be a [employer] resource.  Discuss IF 16:8.   Labs for no energy  ordered.  Discussed sleep study. Pt declined for now.  Will work on weight loss.    See After Visit Summary for Counseling Recommendations

## 2019-04-19 NOTE — Patient Instructions (Signed)
Health Maintenance, Female Adopting a healthy lifestyle and getting preventive care are important in promoting health and wellness. Ask your health care provider about:  The right schedule for you to have regular tests and exams.  Things you can do on your own to prevent diseases and keep yourself healthy. What should I know about diet, weight, and exercise? Eat a healthy diet   Eat a diet that includes plenty of vegetables, fruits, low-fat dairy products, and lean protein.  Do not eat a lot of foods that are high in solid fats, added sugars, or sodium. Maintain a healthy weight Body mass index (BMI) is used to identify weight problems. It estimates body fat based on height and weight. Your health care provider can help determine your BMI and help you achieve or maintain a healthy weight. Get regular exercise Get regular exercise. This is one of the most important things you can do for your health. Most adults should:  Exercise for at least 150 minutes each week. The exercise should increase your heart rate and make you sweat (moderate-intensity exercise).  Do strengthening exercises at least twice a week. This is in addition to the moderate-intensity exercise.  Spend less time sitting. Even light physical activity can be beneficial. Watch cholesterol and blood lipids Have your blood tested for lipids and cholesterol at 31 years of age, then have this test every 5 years. Have your cholesterol levels checked more often if:  Your lipid or cholesterol levels are high.  You are older than 31 years of age.  You are at high risk for heart disease. What should I know about cancer screening? Depending on your health history and family history, you may need to have cancer screening at various ages. This may include screening for:  Breast cancer.  Cervical cancer.  Colorectal cancer.  Skin cancer.  Lung cancer. What should I know about heart disease, diabetes, and high blood  pressure? Blood pressure and heart disease  High blood pressure causes heart disease and increases the risk of stroke. This is more likely to develop in people who have high blood pressure readings, are of African descent, or are overweight.  Have your blood pressure checked: ? Every 3-5 years if you are 18-39 years of age. ? Every year if you are 40 years old or older. Diabetes Have regular diabetes screenings. This checks your fasting blood sugar level. Have the screening done:  Once every three years after age 40 if you are at a normal weight and have a low risk for diabetes.  More often and at a younger age if you are overweight or have a high risk for diabetes. What should I know about preventing infection? Hepatitis B If you have a higher risk for hepatitis B, you should be screened for this virus. Talk with your health care provider to find out if you are at risk for hepatitis B infection. Hepatitis C Testing is recommended for:  Everyone born from 1945 through 1965.  Anyone with known risk factors for hepatitis C. Sexually transmitted infections (STIs)  Get screened for STIs, including gonorrhea and chlamydia, if: ? You are sexually active and are younger than 31 years of age. ? You are older than 31 years of age and your health care provider tells you that you are at risk for this type of infection. ? Your sexual activity has changed since you were last screened, and you are at increased risk for chlamydia or gonorrhea. Ask your health care provider if   you are at risk.  Ask your health care provider about whether you are at high risk for HIV. Your health care provider may recommend a prescription medicine to help prevent HIV infection. If you choose to take medicine to prevent HIV, you should first get tested for HIV. You should then be tested every 3 months for as long as you are taking the medicine. Pregnancy  If you are about to stop having your period (premenopausal) and  you may become pregnant, seek counseling before you get pregnant.  Take 400 to 800 micrograms (mcg) of folic acid every day if you become pregnant.  Ask for birth control (contraception) if you want to prevent pregnancy. Osteoporosis and menopause Osteoporosis is a disease in which the bones lose minerals and strength with aging. This can result in bone fractures. If you are 65 years old or older, or if you are at risk for osteoporosis and fractures, ask your health care provider if you should:  Be screened for bone loss.  Take a calcium or vitamin D supplement to lower your risk of fractures.  Be given hormone replacement therapy (HRT) to treat symptoms of menopause. Follow these instructions at home: Lifestyle  Do not use any products that contain nicotine or tobacco, such as cigarettes, e-cigarettes, and chewing tobacco. If you need help quitting, ask your health care provider.  Do not use street drugs.  Do not share needles.  Ask your health care provider for help if you need support or information about quitting drugs. Alcohol use  Do not drink alcohol if: ? Your health care provider tells you not to drink. ? You are pregnant, may be pregnant, or are planning to become pregnant.  If you drink alcohol: ? Limit how much you use to 0-1 drink a day. ? Limit intake if you are breastfeeding.  Be aware of how much alcohol is in your drink. In the [place] one drink equals one 12 oz bottle of beer (355 mL), one 5 oz glass of wine (148 mL), or one 1 oz glass of hard liquor (44 mL). General instructions  Schedule regular health, dental, and eye exams.  Stay current with your vaccines.  Tell your health care provider if: ? You often feel depressed. ? You have ever been abused or do not feel safe at home. Summary  Adopting a healthy lifestyle and getting preventive care are important in promoting health and wellness.  Follow your health care provider's instructions about healthy  diet, exercising, and getting tested or screened for diseases.  Follow your health care provider's instructions on monitoring your cholesterol and blood pressure. This information is not intended to replace advice given to you by your health care provider. Make sure you discuss any questions you have with your health care provider. Document Released: 03/18/2011 Document Revised: 08/26/2018 Document Reviewed: 08/26/2018 Elsevier Patient Education  2020 Elsevier Inc.  

## 2019-04-20 NOTE — Progress Notes (Signed)
Cholesterol looks good. HDL goal is a little higher but with exercise could get that up.  Albumin a little low. Need a little more protein in diet.  Thyroid great.  No anemia.   Waiting for b12 and vitamin D to result.

## 2019-04-20 NOTE — Progress Notes (Signed)
Iron stores a little elevated. Do not need any extra iron.   Waiting for b12.

## 2019-04-23 ENCOUNTER — Telehealth: Payer: Self-pay | Admitting: Neurology

## 2019-04-23 DIAGNOSIS — E538 Deficiency of other specified B group vitamins: Secondary | ICD-10-CM

## 2019-04-23 LAB — CBC WITH DIFFERENTIAL/PLATELET
Absolute Monocytes: 524 cells/uL (ref 200–950)
Basophils Absolute: 54 cells/uL (ref 0–200)
Basophils Relative: 0.5 %
Eosinophils Absolute: 428 cells/uL (ref 15–500)
Eosinophils Relative: 4 %
HCT: 39.3 % (ref 35.0–45.0)
Hemoglobin: 13.4 g/dL (ref 11.7–15.5)
Lymphs Abs: 2889 cells/uL (ref 850–3900)
MCH: 31.1 pg (ref 27.0–33.0)
MCHC: 34.1 g/dL (ref 32.0–36.0)
MCV: 91.2 fL (ref 80.0–100.0)
MPV: 10 fL (ref 7.5–12.5)
Monocytes Relative: 4.9 %
Neutro Abs: 6805 cells/uL (ref 1500–7800)
Neutrophils Relative %: 63.6 %
Platelets: 366 10*3/uL (ref 140–400)
RBC: 4.31 10*6/uL (ref 3.80–5.10)
RDW: 12.1 % (ref 11.0–15.0)
Total Lymphocyte: 27 %
WBC: 10.7 10*3/uL (ref 3.8–10.8)

## 2019-04-23 LAB — LIPID PANEL W/REFLEX DIRECT LDL
Cholesterol: 173 mg/dL (ref <200)
HDL: 48 mg/dL — ABNORMAL LOW (ref >=50)
LDL Cholesterol (Calc): 105 mg/dL (calc) — ABNORMAL HIGH
Non-HDL Cholesterol (Calc): 125 mg/dL (calc) (ref <130)
Total CHOL/HDL Ratio: 3.6 (calc) (ref <5.0)
Triglycerides: 104 mg/dL (ref <150)

## 2019-04-23 LAB — B12 AND FOLATE PANEL
Folate: 5.7 ng/mL
Vitamin B-12: 249 pg/mL (ref 200–1100)

## 2019-04-23 LAB — VITAMIN D 1,25 DIHYDROXY
Vitamin D 1, 25 (OH)2 Total: 61 pg/mL (ref 18–72)
Vitamin D2 1, 25 (OH)2: <8 pg/mL
Vitamin D3 1, 25 (OH)2: 61 pg/mL

## 2019-04-23 LAB — COMPLETE METABOLIC PANEL WITH GFR
AG Ratio: 1.2 (calc) (ref 1.0–2.5)
ALT: 13 U/L (ref 6–29)
AST: 15 U/L (ref 10–30)
Albumin: 3.5 g/dL — ABNORMAL LOW (ref 3.6–5.1)
Alkaline phosphatase (APISO): 55 U/L (ref 31–125)
BUN: 11 mg/dL (ref 7–25)
CO2: 22 mmol/L (ref 20–32)
Calcium: 9 mg/dL (ref 8.6–10.2)
Chloride: 107 mmol/L (ref 98–110)
Creat: 0.74 mg/dL (ref 0.50–1.10)
GFR, Est African American: 125 mL/min/{1.73_m2} (ref >=60)
GFR, Est Non African American: 108 mL/min/{1.73_m2} (ref >=60)
Globulin: 2.9 g/dL (calc) (ref 1.9–3.7)
Glucose, Bld: 90 mg/dL (ref 65–99)
Potassium: 4.4 mmol/L (ref 3.5–5.3)
Sodium: 139 mmol/L (ref 135–146)
Total Bilirubin: 0.3 mg/dL (ref 0.2–1.2)
Total Protein: 6.4 g/dL (ref 6.1–8.1)

## 2019-04-23 LAB — FERRITIN: Ferritin: 155 ng/mL — ABNORMAL HIGH (ref 16–154)

## 2019-04-23 LAB — TSH: TSH: 1.24 mIU/L

## 2019-04-23 NOTE — Telephone Encounter (Signed)
I will be glad to get this pt scheduled for a B12 injection. When are they due for the injection so I make it for the right time frame?  Thanks, [employer]

## 2019-04-23 NOTE — Progress Notes (Signed)
Call pt: b12 is pretty low. Barely in normal range. Come in to get a shot and then start b12 1047mcg daily and recheck in 2 months.

## 2019-04-23 NOTE — Telephone Encounter (Signed)
Please schedule patient for b12 injection with nurse per Luvenia Starch.  Thanks!

## 2019-04-26 LAB — CYTOLOGY - PAP
Diagnosis: NEGATIVE
HPV 16/18/45 genotyping: NEGATIVE
HPV: DETECTED — AB

## 2019-04-30 NOTE — Telephone Encounter (Signed)
Patient only needs 1 injection. You can schedule her anytime.

## 2019-04-30 NOTE — Telephone Encounter (Signed)
I called pt and left a message for her to call back to schedule a nurse appt for her B12 inj

## 2019-07-15 ENCOUNTER — Other Ambulatory Visit: Payer: Self-pay

## 2019-07-15 ENCOUNTER — Ambulatory Visit (INDEPENDENT_AMBULATORY_CARE_PROVIDER_SITE_OTHER): Payer: 59 | Admitting: Family Medicine

## 2019-07-15 VITALS — Temp 98.4°F | Ht 67.0 in | Wt 260.0 lb

## 2019-07-15 DIAGNOSIS — Z23 Encounter for immunization: Secondary | ICD-10-CM | POA: Diagnosis not present

## 2019-07-15 NOTE — Progress Notes (Signed)
Patient presents to clinic for a yearly flu vaccine. She tolerated the injection well in left deltoid with no immediate complications. Patient denies any severe reaction in the past other than having a sore arm for a day or two. She also denies an allergy to latex or eggs and denies any fever in the last 48 hours. Patient requested a letter for work of proof that she has had a vaccine and it was written and printed. No other questions during the visit.

## 2019-07-27 ENCOUNTER — Encounter: Payer: Self-pay | Admitting: Physician Assistant

## 2019-08-04 ENCOUNTER — Emergency Department
Admission: EM | Admit: 2019-08-04 | Discharge: 2019-08-04 | Disposition: A | Discharge status: Home / Self Care | Payer: 59 | Source: Home / Self Care

## 2019-08-04 ENCOUNTER — Other Ambulatory Visit: Payer: Self-pay

## 2019-08-04 ENCOUNTER — Telehealth: Payer: 59

## 2019-08-04 ENCOUNTER — Encounter: Payer: Self-pay | Admitting: *Deleted

## 2019-08-04 DIAGNOSIS — H9201 Otalgia, right ear: Secondary | ICD-10-CM | POA: Diagnosis not present

## 2019-08-04 NOTE — Discharge Instructions (Signed)
°  You may take 500mg  acetaminophen every 4-6 hours or in combination with ibuprofen 400-600mg  every 6-8 hours as needed for pain, inflammation, and fever.  Be sure to well hydrated with clear liquids and get at least 8 hours of sleep at night, preferably more while sick.   Please follow up with family medicine in 1 week if needed, sooner if worsening- fever, worsening pain, more drainage.   AVOID using Q-tips or sticking anything else in your ears as that can cause damage to the delicate tissue in your ear and on the ear drum itself. Avoid forcefully "popping" your ears.

## 2019-08-04 NOTE — ED Provider Notes (Signed)
[facility]    CSN: [id number] Arrival date & time: 08/04/19  1257      History   Chief Complaint Chief Complaint  Patient presents with  . Otalgia    HPI Shelby Cook is a 33 y.o. female.   HPI Shelby Cook is a 33 y.o. female presenting to UC with c/o Right ear pain that started this morning. She reports taking a shower when her ear popped and she noticed a small amount of white and bloody discharge.  Mild nasal congestion the last few days.  Denies HA, dizziness, sore throat, fever, chills, n/v/d. No medication taken PTA.    Past Medical History:  Diagnosis Date  . Allergy   . Vaginal Pap smear, abnormal     Patient Active Problem List   Diagnosis Date Noted  . History of abnormal cervical Pap smear 04/19/2019  . Encounter for smoking cessation counseling 04/03/2018  . Need for rhogam due to Rh negative mother 11/19/2017  . Right knee pain 10/23/2015  . Abnormal weight gain 10/23/2015  . Morbid obesity (HCC) 10/23/2015  . Environmental allergies 08/18/2015  . Seasonal allergies 08/04/2015  . Anxiety and depression 08/04/2015  . Multiple allergies 08/04/2015  . HEADACHE 07/25/2010  . 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 OBESITY NOS 02/16/2007  . ALLERGIC RHINITIS 02/16/2007  . OLIGOMENORRHEA 02/16/2007  . ACNE NEC 02/16/2007    Past Surgical History:  Procedure Laterality Date  . WISDOM TOOTH EXTRACTION      OB History    Gravida  1   Para      Term      Preterm      AB  1   Living        SAB  1   TAB      Ectopic      Multiple      Live Births               Home Medications    Prior to Admission medications   Medication Sig Start Date End Date Taking? Authorizing Provider  diphenhydrAMINE (BENADRYL) 25 MG tablet Take 25 mg by mouth at bedtime.    [provider]  loratadine (CLARITIN) 10 MG tablet Take 10 mg by mouth daily.    [provider]   montelukast (SINGULAIR) 10 MG tablet Take 1 tablet (10 mg total) by mouth at bedtime. 03/29/19   Breeback, Jade L, PA-C  vortioxetine HBr (TRINTELLIX) 5 MG TABS tablet TAKE 1 TABLET (5 MG TOTAL) BY MOUTH DAILY. 03/29/19   Jomarie LongsBreeback, Jade L, PA-C    Family History Family History  Problem Relation Age of Onset  . Drug abuse Father   . Mental illness Father        bipolar  . Diabetes Father   . Hypertension Father   . Heart attack Father   . Osteoporosis Mother   . Scoliosis Sister   . Cancer Paternal Grandmother        breast and lung  . Diabetes Paternal Grandfather   . Heart attack Paternal Grandfather   . Allergies Sister     Social History Social History   Tobacco Use  . Smoking status: Current Every Day Smoker    Packs/day: 0.25    Types: Cigarettes  . Smokeless tobacco: Never Used  Substance Use Topics  . Alcohol use: Yes    Alcohol/week: 0.0 standard drinks  . Drug use: No     Allergies   Celexa [citalopram  hydrobromide] and Flonase [fluticasone propionate]   Review of Systems Review of Systems  Constitutional: Negative for chills and fever.  HENT: Positive for congestion, ear discharge and ear pain ( Right). Negative for sore throat, trouble swallowing and voice change.   Respiratory: Negative for cough and shortness of breath.   Cardiovascular: Negative for chest pain and palpitations.  Gastrointestinal: Negative for abdominal pain, diarrhea, nausea and vomiting.  Musculoskeletal: Negative for arthralgias, back pain and myalgias.  Skin: Negative for rash.  Neurological: Negative for dizziness, light-headedness and headaches.     Physical Exam Triage Vital Signs ED Triage Vitals  Enc Vitals Group     BP 08/04/19 1309 122/78     Pulse Rate 08/04/19 1309 93     Resp 08/04/19 1309 18     Temp 08/04/19 1309 99.2 F (37.3 C)     Temp Source 08/04/19 1309 Oral     SpO2 08/04/19 1309 100 %     Weight 08/04/19 1310 263 lb (119.3 kg)     Height 08/04/19  1310 5\' 7"  (1.702 m)     Head Circumference --      Peak Flow --      Pain Score 08/04/19 1310 3     Pain Loc --      Pain Edu? --      Excl. in [place]? --    No data found.  Updated Vital Signs BP 122/78 (BP Location: Right Arm)   Pulse 93   Temp 99.2 F (37.3 C) (Oral)   Resp 18   Ht 5\' 7"  (1.702 m)   Wt 263 lb (119.3 kg)   LMP 07/06/2019   SpO2 100%   BMI 41.19 kg/m   Visual Acuity Right Eye Distance:   Left Eye Distance:   Bilateral Distance:    Right Eye Near:   Left Eye Near:    Bilateral Near:     Physical Exam Vitals signs and nursing note reviewed.  Constitutional:      Appearance: Normal appearance. She is well-developed.  HENT:     Head: Normocephalic and atraumatic.     Right Ear: Tympanic membrane and ear canal normal. No drainage, swelling or tenderness.     Left Ear: Tympanic membrane and ear canal normal. No drainage, swelling or tenderness.     Nose: Nose normal.     Right Sinus: No maxillary sinus tenderness or frontal sinus tenderness.     Left Sinus: No maxillary sinus tenderness or frontal sinus tenderness.     Mouth/Throat:     Lips: Pink.     Mouth: Mucous membranes are moist.     Pharynx: Oropharynx is clear. Uvula midline.  Neck:     Musculoskeletal: Normal range of motion.  Cardiovascular:     Rate and Rhythm: Normal rate and regular rhythm.  Pulmonary:     Effort: Pulmonary effort is normal. No respiratory distress.     Breath sounds: Normal breath sounds.  Musculoskeletal: Normal range of motion.  Skin:    General: Skin is warm and dry.  Neurological:     Mental Status: She is alert and oriented to person, place, and time.  Psychiatric:        Behavior: Behavior normal.      UC Treatments / Results  Labs (all labs ordered are listed, but only abnormal results are displayed) Labs Reviewed - No data to display  EKG   Radiology No results found.  Procedures Procedures (including critical care time)  Medications  Ordered in UC Medications - No data to display  Initial Impression / Assessment and Plan / UC Course  I have reviewed the triage vital signs and the nursing notes.  Pertinent labs & imaging results that were available during my care of the patient were reviewed by me and considered in my medical decision making (see chart for details).     Normal exam Reassured pt no evidence of ear infection or rupture. Encouraged saline sinus rinses  Avoid forcefully popping ears  F/u as needed AVS provided  Final Clinical Impressions(s) / UC Diagnoses   Final diagnoses:  Acute otalgia, right     Discharge Instructions      You may take 500mg  acetaminophen every 4-6 hours or in combination with ibuprofen 400-600mg  every 6-8 hours as needed for pain, inflammation, and fever.  Be sure to well hydrated with clear liquids and get at least 8 hours of sleep at night, preferably more while sick.   Please follow up with family medicine in 1 week if needed, sooner if worsening- fever, worsening pain, more drainage.   AVOID using Q-tips or sticking anything else in your ears as that can cause damage to the delicate tissue in your ear and on the ear drum itself. Avoid forcefully "popping" your ears.     ED Prescriptions    None     I have reviewed the PDMP during this encounter.   , Lurene Shadow 08/05/19 [phone]

## 2019-08-04 NOTE — ED Triage Notes (Signed)
Pt c/o RT ear popped and discharge this morning, followed by pain.

## 2019-11-12 ENCOUNTER — Encounter: Payer: Self-pay | Admitting: Physician Assistant

## 2019-11-12 MED ORDER — VORTIOXETINE HBR 10 MG PO TABS: 10.0000 mg | ORAL_TABLET | Freq: Every day | ORAL | 0 refills | Status: DC

## 2019-11-15 ENCOUNTER — Other Ambulatory Visit: Payer: Self-pay

## 2019-11-15 ENCOUNTER — Emergency Department
Admission: EM | Admit: 2019-11-15 | Discharge: 2019-11-15 | Disposition: A | Discharge status: Home / Self Care | Payer: 59 | Source: Home / Self Care

## 2019-11-15 DIAGNOSIS — B9689 Other specified bacterial agents as the cause of diseases classified elsewhere: Secondary | ICD-10-CM | POA: Diagnosis not present

## 2019-11-15 DIAGNOSIS — J019 Acute sinusitis, unspecified: Secondary | ICD-10-CM

## 2019-11-15 MED ORDER — ONDANSETRON 4 MG PO TBDP: ORAL_TABLET | ORAL | 0 refills | Status: DC

## 2019-11-15 MED ORDER — AMOXICILLIN-POT CLAVULANATE 875-125 MG PO TABS: 1.0000 | ORAL_TABLET | Freq: Two times a day (BID) | ORAL | 0 refills | Status: DC

## 2019-11-15 NOTE — ED Provider Notes (Signed)
[facility]    CSN: [id number] Arrival date & time: 11/15/19  0805      History   Chief Complaint Chief Complaint  Patient presents with  . Facial Pain    sinus pressure    HPI Shelby Cook is a 34 y.o. female.   HPI  Shelby Cook is a 34 y.o. female presenting to UC with c/o 7 days of gradually worsening sinus pain, pressure and congestion.   She has had mild intermittent lightheadedness, nausea without vomiting and dizziness. This morning after taking a warm shower she felt increased pressure in her sinuses and had small amount of clear drainage out of her Left ear. Occasional sharp aching pain from Left ear that radiates down into her jaw and neck.  She has tried [employer] w/o relief.  Denies fever, chills, cough.  No known sick contacts. Hx of sinus infections in the past.     Past Medical History:  Diagnosis Date  . Allergy   . Vaginal Pap smear, abnormal     Patient Active Problem List   Diagnosis Date Noted  . History of abnormal cervical Pap smear 04/19/2019  . Encounter for smoking cessation counseling 04/03/2018  . Need for rhogam due to Rh negative mother 11/19/2017  . Right knee pain 10/23/2015  . Abnormal weight gain 10/23/2015  . Morbid obesity (HCC) 10/23/2015  . Environmental allergies 08/18/2015  . Seasonal allergies 08/04/2015  . Anxiety and depression 08/04/2015  . Multiple allergies 08/04/2015  . HEADACHE 07/25/2010  . 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 OBESITY NOS 02/16/2007  . ALLERGIC RHINITIS 02/16/2007  . OLIGOMENORRHEA 02/16/2007  . ACNE NEC 02/16/2007    Past Surgical History:  Procedure Laterality Date  . WISDOM TOOTH EXTRACTION      OB History    Gravida  1   Para      Term      Preterm      AB  1   Living        SAB  1   TAB      Ectopic      Multiple      Live Births               Home Medications    Prior to Admission medications     Medication Sig Start Date End Date Taking? Authorizing Provider  amoxicillin-clavulanate (AUGMENTIN) 875-125 MG tablet Take 1 tablet by mouth 2 (two) times daily. One po bid x 7 days 11/15/19   Lurene Shadow, PA-C  diphenhydrAMINE (BENADRYL) 25 MG tablet Take 25 mg by mouth at bedtime.    [provider]  loratadine (CLARITIN) 10 MG tablet Take 10 mg by mouth daily.    [provider]  montelukast (SINGULAIR) 10 MG tablet Take 1 tablet (10 mg total) by mouth at bedtime. 03/29/19   Jomarie Longs, PA-C  ondansetron (ZOFRAN ODT) 4 MG disintegrating tablet 4mg  ODT q4 hours prn nausea/vomit 11/15/19   Nilton Lave, 01/15/20, PA-C  vortioxetine HBr (TRINTELLIX) 10 MG TABS tablet Take 1 tablet (10 mg total) by mouth daily. 11/12/19   11/14/19, PA-C    Family History Family History  Problem Relation Age of Onset  . Drug abuse Father   . Mental illness Father        bipolar  . Diabetes Father   . Hypertension Father   . Heart attack Father   . Osteoporosis Mother   . Scoliosis  Sister   . Cancer Paternal Grandmother        breast and lung  . Diabetes Paternal Grandfather   . Heart attack Paternal Grandfather   . Allergies Sister     Social History Social History   Tobacco Use  . Smoking status: Current Every Day Smoker    Packs/day: 0.25    Types: Cigarettes  . Smokeless tobacco: Never Used  Substance Use Topics  . Alcohol use: Yes    Alcohol/week: 0.0 standard drinks  . Drug use: No     Allergies   Celexa [citalopram hydrobromide] and Flonase [fluticasone propionate]   Review of Systems Review of Systems  Constitutional: Negative for chills and fever.  HENT: Positive for congestion, ear discharge (left clear), ear pain (Left), sinus pressure and sinus pain. Negative for sore throat, trouble swallowing and voice change.   Respiratory: Negative for cough and shortness of breath.   Cardiovascular: Negative for chest pain and palpitations.  Gastrointestinal:  Positive for nausea. Negative for abdominal pain, diarrhea and vomiting.  Musculoskeletal: Negative for arthralgias, back pain and myalgias.  Skin: Negative for rash.  Neurological: Positive for dizziness, light-headedness and headaches. Negative for syncope.     Physical Exam Triage Vital Signs ED Triage Vitals [11/15/19 0837]  Enc Vitals Group     BP 126/76     Pulse Rate 77     Resp 18     Temp 98.2 F (36.8 C)     Temp Source Oral     SpO2 100 %     Weight 270 lb (122.5 kg)     Height 5\' 7"  (1.702 m)     Head Circumference      Peak Flow      Pain Score 0     Pain Loc      Pain Edu?      Excl. in [place]?    No data found.  Updated Vital Signs BP 126/76 (BP Location: Right Arm)   Pulse 77   Temp 98.2 F (36.8 C) (Oral)   Resp 18   Ht 5\' 7"  (1.702 m)   Wt 270 lb (122.5 kg)   LMP 11/01/2019 (Exact Date)   SpO2 100%   BMI 42.29 kg/m   Visual Acuity Right Eye Distance:   Left Eye Distance:   Bilateral Distance:    Right Eye Near:   Left Eye Near:    Bilateral Near:     Physical Exam Vitals and nursing note reviewed.  Constitutional:      General: She is not in acute distress.    Appearance: Normal appearance. She is well-developed. She is not ill-appearing, toxic-appearing or diaphoretic.  HENT:     Head: Normocephalic and atraumatic.     Right Ear: Tympanic membrane and ear canal normal.     Left Ear: Tympanic membrane and ear canal normal.     Nose:     Right Sinus: Maxillary sinus tenderness present. No frontal sinus tenderness.     Left Sinus: Maxillary sinus tenderness and frontal sinus tenderness present.     Mouth/Throat:     Pharynx: Posterior oropharyngeal erythema present. No oropharyngeal exudate.  Eyes:     Extraocular Movements: Extraocular movements intact.     Conjunctiva/sclera: Conjunctivae normal.  Cardiovascular:     Rate and Rhythm: Normal rate and regular rhythm.  Pulmonary:     Effort: Pulmonary effort is normal. No respiratory  distress.     Breath sounds: Normal breath sounds. No stridor. No  wheezing or rhonchi.  Musculoskeletal:        General: Normal range of motion.     Cervical back: Normal range of motion and neck supple.  Skin:    General: Skin is warm and dry.  Neurological:     Mental Status: She is alert and oriented to person, place, and time.  Psychiatric:        Behavior: Behavior normal.      UC Treatments / Results  Labs (all labs ordered are listed, but only abnormal results are displayed) Labs Reviewed - No data to display  EKG   Radiology No results found.  Procedures Procedures (including critical care time)  Medications Ordered in UC Medications - No data to display  Initial Impression / Assessment and Plan / UC Course  I have reviewed the triage vital signs and the nursing notes.  Pertinent labs & imaging results that were available during my care of the patient were reviewed by me and considered in my medical decision making (see chart for details).     Hx and exam c/w bacterial sinus infection given progression of worsening symptoms despite home treatments.  Will tx with Augmentin and zofran for her nausea F/u in 7-10 days as needed with PCP.  Final Clinical Impressions(s) / UC Diagnoses   Final diagnoses:  Acute bacterial rhinosinusitis     Discharge Instructions      Please take antibiotics as prescribed and be sure to complete entire course even if you start to feel better to ensure infection does not come back.  You may take 500mg  acetaminophen every 4-6 hours or in combination with ibuprofen 400-600mg  every 6-8 hours as needed for pain, inflammation, and fever.  Be sure to well hydrated with clear liquids and get at least 8 hours of sleep at night, preferably more while sick.   Please follow up with family medicine in 7-10 days if not improving.      ED Prescriptions    Medication Sig Dispense Auth. Provider   amoxicillin-clavulanate (AUGMENTIN)  875-125 MG tablet Take 1 tablet by mouth 2 (two) times daily. One po bid x 7 days 14 tablet Jaxxson Cavanah O, PA-C   ondansetron (ZOFRAN ODT) 4 MG disintegrating tablet 4mg  ODT q4 hours prn nausea/vomit 12 tablet Noe Gens, PA-C     PDMP not reviewed this encounter.   Noe Gens, [place] 11/15/19 [phone]

## 2019-11-15 NOTE — Discharge Instructions (Signed)
°  Please take antibiotics as prescribed and be sure to complete entire course even if you start to feel better to ensure infection does not come back.  You may take 500mg  acetaminophen every 4-6 hours or in combination with ibuprofen 400-600mg  every 6-8 hours as needed for pain, inflammation, and fever.  Be sure to well hydrated with clear liquids and get at least 8 hours of sleep at night, preferably more while sick.   Please follow up with family medicine in 7-10 days if not improving.

## 2019-11-15 NOTE — ED Triage Notes (Addendum)
Pt presents to clinic due to 7 days of worsening sinus symptoms. She c/o sinus pressure and drainage (including drainage from L ear), nausea, lightheadedness, and dizziness. She has taken zyrtec and singular otc without any improvement. Of note, pt completed 2 out of 2 doses for Covid19 vaccine last dose given 10/20/2019.

## 2019-11-22 ENCOUNTER — Ambulatory Visit: Payer: 59 | Admitting: Physician Assistant

## 2019-11-22 ENCOUNTER — Encounter: Payer: Self-pay | Admitting: Physician Assistant

## 2019-11-22 MED ORDER — FLUCONAZOLE 150 MG PO TABS: 150.0000 mg | ORAL_TABLET | Freq: Once | ORAL | 1 refills | Status: AC

## 2019-11-22 NOTE — Telephone Encounter (Signed)
Pended  RX

## 2019-12-22 ENCOUNTER — Encounter: Payer: Self-pay | Admitting: Physician Assistant

## 2019-12-24 ENCOUNTER — Telehealth (INDEPENDENT_AMBULATORY_CARE_PROVIDER_SITE_OTHER): Payer: 59 | Admitting: Physician Assistant

## 2019-12-24 VITALS — Ht 67.0 in | Wt 258.0 lb

## 2019-12-24 DIAGNOSIS — F419 Anxiety disorder, unspecified: Secondary | ICD-10-CM

## 2019-12-24 DIAGNOSIS — F32A Depression, unspecified: Secondary | ICD-10-CM

## 2019-12-24 DIAGNOSIS — F329 Major depressive disorder, single episode, unspecified: Secondary | ICD-10-CM

## 2019-12-24 MED ORDER — VORTIOXETINE HBR 10 MG PO TABS: 10.0000 mg | ORAL_TABLET | Freq: Every day | ORAL | 1 refills | Status: DC

## 2019-12-24 NOTE — Progress Notes (Signed)
Patient ID: Shelby Cook, female   DOB: 06-10-1986, 34 y.o.   MRN: [id number] .Marland KitchenVirtual Visit via Video Note  I connected with Shelby Cook on 12/24/2019 at  3:20 PM EDT by a video enabled telemedicine application and verified that I am speaking with the correct person using two identifiers.  Location: Patient: car Provider: clinic   I discussed the limitations of evaluation and management by telemedicine and the availability of in person appointments. The patient expressed understanding and agreed to proceed.  History of Present Illness: Patient is a 34 year old female with anxiety and depression who comes into the clinic to discuss medication refill.  She feels like she is doing okay.  She does feel like her anxiety and depression have increased.  She does have some wife circumstances that she feels like is contributing.  She denies any suicidal or homicidal thoughts.  Her increase to 10 mg just happened a few weeks ago.  She does feel like it could be helping.  She does notice some GI side effects when she first takes it.  She admits she is not exercising, counseling, meditating.   .. Active Ambulatory Problems    Diagnosis Date Noted  . OBESITY NOS 02/16/2007  . ALLERGIC RHINITIS 02/16/2007  . OLIGOMENORRHEA 02/16/2007  . 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 HEADACHE 07/25/2010  . 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 Seasonal allergies 08/04/2015  . Anxiety and depression 08/04/2015  . Multiple allergies 08/04/2015  . Environmental allergies 08/18/2015  . Right knee pain 10/23/2015  . Abnormal weight gain 10/23/2015  . Morbid obesity ([place]) 10/23/2015  . Need for rhogam due to Rh negative mother 11/19/2017  . Encounter for smoking cessation counseling 04/03/2018  . History of abnormal cervical Pap smear 04/19/2019   Resolved Ambulatory Problems    Diagnosis Date Noted  . No Resolved Ambulatory Problems   Past Medical History:  Diagnosis Date  .  Allergy   . Vaginal Pap smear, abnormal    Reviewed med, allergy, problem list.    Observations/Objective: No acute distress. Normal mood and appearance.   .. Today's Vitals   12/24/19 1426  Weight: 258 lb (117 kg)  Height: 5\' 7"  (1.702 m)   Body mass index is 40.41 kg/m.   .. Depression screen [facility] 2/9 12/24/2019 03/29/2019 10/07/2018 04/03/2018 11/01/2016  Decreased Interest 0 0 1 1 2   Down, Depressed, Hopeless 2 1 2 1 2   PHQ - 2 Score 2 1 3 2 4   Altered sleeping 3 1 2 1 2   Tired, decreased energy 3 1 1 1 1   Change in appetite 2 2 1 1 2   Feeling bad or failure about yourself  2 1 1 1 2   Trouble concentrating 0 0 0 0 2  Moving slowly or fidgety/restless 0 0 0 0 0  Suicidal thoughts 0 0 0 0 1  PHQ-9 Score 12 6 8 6 14   Difficult doing work/chores Somewhat difficult Somewhat difficult Somewhat difficult Somewhat difficult -   .. GAD 7 : Generalized Anxiety Score 12/24/2019 03/29/2019 10/07/2018 04/03/2018  Nervous, Anxious, on Edge 1 1 2 2   Control/stop worrying 1 1 2 2   Worry too much - different things 1 1 1 1   Trouble relaxing 2 1 1 2   Restless 2 0 0 1  Easily annoyed or irritable 2 1 3 2   Afraid - awful might happen 1 1 3  0  Total GAD 7 Score 10 6 12 10   Anxiety Difficulty Somewhat difficult Somewhat difficult Somewhat  difficult Somewhat difficult     Assessment and Plan: Marland KitchenMarland KitchenDereka was seen today for depression.  Diagnoses and all orders for this visit:  Anxiety and depression -     vortioxetine HBr (TRINTELLIX) 10 MG TABS tablet; Take 1 tablet (10 mg total) by mouth daily.   PHQ-9 and GAD-7 scores are not improving.  Patient just made the increase to 10 mg.  She feels like it is helping some.  She does not want to increase any further.  Discussed counseling, meditation, exercise to further help with anxiety and depression.  Follow-up as needed or in 3 months.  Discussed we could add BuSpar for anxiety up to 3 times a day.  We could increase Trintellix as well.   Follow-up closely.  Follow Up Instructions:    I discussed the assessment and treatment plan with the patient. The patient was provided an opportunity to ask questions and all were answered. The patient agreed with the plan and demonstrated an understanding of the instructions.   The patient was advised to call back or seek an in-person evaluation if the symptoms worsen or if the condition fails to improve as anticipated.  I provided 15 minutes of non-face-to-face time during this encounter.   Tandy Gaw, PA-C

## 2019-12-24 NOTE — Progress Notes (Signed)
Wants to discuss increasing Trintellix went from 5 mg to 10 mg  No other issues PHQ9-GAD7 completed.

## 2019-12-27 ENCOUNTER — Encounter: Payer: Self-pay | Admitting: Physician Assistant

## 2020-01-11 ENCOUNTER — Encounter: Payer: Self-pay | Admitting: Physician Assistant

## 2020-02-15 ENCOUNTER — Ambulatory Visit (HOSPITAL_COMMUNITY)
Admission: EM | Admit: 2020-02-15 | Discharge: 2020-02-15 | Disposition: A | Discharge status: Home / Self Care | Payer: 59 | Attending: Physician Assistant | Admitting: Physician Assistant

## 2020-02-15 ENCOUNTER — Encounter (HOSPITAL_COMMUNITY): Payer: Self-pay

## 2020-02-15 ENCOUNTER — Other Ambulatory Visit: Payer: Self-pay

## 2020-02-15 DIAGNOSIS — T7840XA Allergy, unspecified, initial encounter: Secondary | ICD-10-CM | POA: Diagnosis not present

## 2020-02-15 DIAGNOSIS — L509 Urticaria, unspecified: Secondary | ICD-10-CM | POA: Diagnosis not present

## 2020-02-15 MED ORDER — PREDNISONE 10 MG PO TABS: 40.0000 mg | ORAL_TABLET | Freq: Every day | ORAL | 0 refills | Status: AC

## 2020-02-15 MED ORDER — HYDROXYZINE HCL 25 MG PO TABS: 25.0000 mg | ORAL_TABLET | Freq: Four times a day (QID) | ORAL | 0 refills | Status: DC

## 2020-02-15 NOTE — ED Provider Notes (Signed)
[place]    CSN: [id number] Arrival date & time: 02/15/20  1219      History   Chief Complaint Chief Complaint  Patient presents with  . Allergic Reaction    HPI Shelby Cook is a 34 y.o. female.   Patient presents urgent care for evaluation of rash and feeling of tongue swelling.  She reports the rash started on her chest earlier today and since spread to her arms and upper neck.  She reports it is itchy.  She reports she is unsure why this started.  She reports she felt like maybe her tongue was swelling and was" too big for her mouth".  She reports the rash has maybe improved little bit with ice being applied to the denies difficulty breathing, throat swelling or difficulty swallowing.  She was brought here by a coworker and concern for allergic reaction.  She has known allergies however is unsure if she had an exposure to any of these.  Denies any new lotions, skin care products, clothes, detergents, medicines.       Past Medical History:  Diagnosis Date  . Allergy   . Vaginal Pap smear, abnormal     Patient Active Problem List   Diagnosis Date Noted  . History of abnormal cervical Pap smear 04/19/2019  . Encounter for smoking cessation counseling 04/03/2018  . Need for rhogam due to Rh negative mother 11/19/2017  . Right knee pain 10/23/2015  . Abnormal weight gain 10/23/2015  . Morbid obesity ([place]) 10/23/2015  . Environmental allergies 08/18/2015  . Seasonal allergies 08/04/2015  . Anxiety and depression 08/04/2015  . Multiple allergies 08/04/2015  . HEADACHE 07/25/2010  . 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 OBESITY NOS 02/16/2007  . ALLERGIC RHINITIS 02/16/2007  . OLIGOMENORRHEA 02/16/2007  . ACNE NEC 02/16/2007    Past Surgical History:  Procedure Laterality Date  . WISDOM TOOTH EXTRACTION      OB History    Gravida  1   Para      Term      Preterm      AB  1   Living        SAB  1   TAB        Ectopic      Multiple      Live Births               Home Medications    Prior to Admission medications   Medication Sig Start Date End Date Taking? Authorizing Provider  diphenhydrAMINE (BENADRYL) 25 MG tablet Take 25 mg by mouth at bedtime.    [provider]  hydrOXYzine (ATARAX/VISTARIL) 25 MG tablet Take 1 tablet (25 mg total) by mouth every 6 (six) hours. 02/15/20   Tobie Hellen, Marguerita Beards, PA-C  loratadine (CLARITIN) 10 MG tablet Take 10 mg by mouth daily.    [provider]  montelukast (SINGULAIR) 10 MG tablet Take 1 tablet (10 mg total) by mouth at bedtime. 03/29/19   Breeback, Jade L, PA-C  predniSONE (DELTASONE) 10 MG tablet Take 4 tablets (40 mg total) by mouth daily with breakfast for 5 days. 02/15/20 02/20/20  Marieke Lubke, Marguerita Beards, PA-C  vortioxetine HBr (TRINTELLIX) 10 MG TABS tablet Take 1 tablet (10 mg total) by mouth daily. 12/24/19   Donella Stade, PA-C    Family History Family History  Problem Relation Age of Onset  . Drug abuse Father   . Mental illness Father  bipolar  . Diabetes Father   . Hypertension Father   . Heart attack Father   . Osteoporosis Mother   . Scoliosis Sister   . Cancer Paternal Grandmother        breast and lung  . Diabetes Paternal Grandfather   . Heart attack Paternal Grandfather   . Allergies Sister     Social History Social History   Tobacco Use  . Smoking status: Current Some Day Smoker    Packs/day: 0.25    Types: Cigarettes  . Smokeless tobacco: Never Used  Substance Use Topics  . Alcohol use: Yes    Alcohol/week: 0.0 standard drinks  . Drug use: No     Allergies   Celexa [citalopram hydrobromide] and Flonase [fluticasone propionate]   Review of Systems Review of Systems   Physical Exam Triage Vital Signs ED Triage Vitals  Enc Vitals Group     BP 02/15/20 1247 122/73     Pulse Rate 02/15/20 1247 97     Resp 02/15/20 1247 16     Temp 02/15/20 1247 98.8 F (37.1 C)     Temp Source  02/15/20 1247 Oral     SpO2 02/15/20 1247 98 %     Weight --      Height --      Head Circumference --      Peak Flow --      Pain Score 02/15/20 1248 0     Pain Loc --      Pain Edu? --      Excl. in [place]? --    No data found.  Updated Vital Signs BP 122/73 (BP Location: Right Arm)   Pulse 97   Temp 98.8 F (37.1 C) (Oral)   Resp 16   LMP 01/25/2020 (Exact Date)   SpO2 98%   Visual Acuity Right Eye Distance:   Left Eye Distance:   Bilateral Distance:    Right Eye Near:   Left Eye Near:    Bilateral Near:     Physical Exam Vitals and nursing note reviewed.  Constitutional:      General: She is not in acute distress.    Appearance: She is well-developed.  HENT:     Head: Normocephalic and atraumatic.     Mouth/Throat:     Mouth: Mucous membranes are moist.     Pharynx: Oropharynx is clear.     Comments: No angioedema present.  Tongue does not appear swollen.  Oropharynx clear.  Controlling saliva.  No stridorous respiration. Eyes:     Conjunctiva/sclera: Conjunctivae normal.  Cardiovascular:     Rate and Rhythm: Normal rate and regular rhythm.     Heart sounds: No murmur.  Pulmonary:     Effort: Pulmonary effort is normal. No respiratory distress.     Breath sounds: Normal breath sounds. No wheezing.  Abdominal:     Palpations: Abdomen is soft.     Tenderness: There is no abdominal tenderness.  Musculoskeletal:     Cervical back: Neck supple.  Skin:    General: Skin is warm and dry.     Comments: Fine papular urticarial-like rash on chest and upper arms.  Scratch marks on upper arm showing erythema and dermatographia.  There is also extension of the rash into the lower neck  Neurological:     Mental Status: She is alert.      UC Treatments / Results  Labs (all labs ordered are listed, but only abnormal results are displayed) Labs Reviewed -  No data to display  EKG   Radiology No results found.  Procedures Procedures (including critical care  time)  Medications Ordered in UC Medications - No data to display  Initial Impression / Assessment and Plan / UC Course  I have reviewed the triage vital signs and the nursing notes.  Pertinent labs & imaging results that were available during my care of the patient were reviewed by me and considered in my medical decision making (see chart for details).     #Urticaria #Allergic reaction Patient 34 year old presenting with urticarial allergic response.  No obvious angioedema or pulmonary involvement.  Will give short course of prednisone, hydroxyzine and recommend continue Zyrtec at home.  Discussed return emergency department precautions.  Patient verbalized understanding. Final Clinical Impressions(s) / UC Diagnoses   Final diagnoses:  Allergic reaction, initial encounter  Urticaria     Discharge Instructions     Take the prednisone in the morning for 5 days Use the hydroxyzine as needed for itch Continue zyrtec You may also take benadryl at night   Benadryl and hydroxyzine will likely make you sleepy, do not drive after taking these for 8 hours  If worsening symptoms return or report to the Emergency Department      ED Prescriptions    Medication Sig Dispense Auth. Provider   predniSONE (DELTASONE) 10 MG tablet Take 4 tablets (40 mg total) by mouth daily with breakfast for 5 days. 20 tablet Aiken Withem, Veryl Speak, PA-C   hydrOXYzine (ATARAX/VISTARIL) 25 MG tablet Take 1 tablet (25 mg total) by mouth every 6 (six) hours. 12 tablet Montay Vanvoorhis, Veryl Speak, PA-C     PDMP not reviewed this encounter.   Hermelinda Medicus, PA-C 02/15/20 1918

## 2020-02-15 NOTE — Discharge Instructions (Signed)
Take the prednisone in the morning for 5 days Use the hydroxyzine as needed for itch Continue zyrtec You may also take benadryl at night   Benadryl and hydroxyzine will likely make you sleepy, do not drive after taking these for 8 hours  If worsening symptoms return or report to the Emergency Department

## 2020-02-15 NOTE — ED Triage Notes (Signed)
Reports around 11am at work she started to feel her chest was itching. When she looked down, she says there were welts on chest, going up the neck and face, back, shoulder, and arms. Used an ice pack and it has gone down. Has not taken any medication. Also reports she feels her "tongue is too big" for her mouth; denies SOB.

## 2020-02-18 ENCOUNTER — Ambulatory Visit (INDEPENDENT_AMBULATORY_CARE_PROVIDER_SITE_OTHER)
Admission: RE | Admit: 2020-02-18 | Discharge: 2020-02-18 | Disposition: A | Discharge status: Home / Self Care | Payer: 59 | Source: Ambulatory Visit

## 2020-02-18 ENCOUNTER — Other Ambulatory Visit: Payer: Self-pay

## 2020-02-18 DIAGNOSIS — J069 Acute upper respiratory infection, unspecified: Secondary | ICD-10-CM

## 2020-02-18 MED ORDER — BENZONATATE 100 MG PO CAPS: 100.0000 mg | ORAL_CAPSULE | Freq: Three times a day (TID) | ORAL | 0 refills | Status: DC

## 2020-02-18 NOTE — Discharge Instructions (Signed)
Most likely viral URI Tessalon Perles for cough as needed.  Continue your allergy medication Follow up as needed for continued or worsening symptoms

## 2020-02-18 NOTE — ED Provider Notes (Signed)
Virtual Visit via Video Note:  Shelby Cook  initiated request for Telemedicine visit with [facility]gent [facility] team. I connected with Shelby Cook  on 02/18/2020 at 1:32 PM  for a synchronized telemedicine visit using a video enabled HIPPA compliant telemedicine application. I verified that I am speaking with Shelby Cook  using two identifiers. [name], NP  was physically located in a [facility] Urgent care site and Enis Leatherwood was located at a different location.   The limitations of evaluation and management by telemedicine as well as the availability of in-person appointments were discussed. Patient was informed that she  may incur a bill ( including co-pay) for this virtual visit encounter. [name]  expressed understanding and gave verbal consent to proceed with virtual visit.     History of Present Illness:Shelby Cook  is a 34 y.o. female presents with  Dry cough and chest pressure since Wednesday. Denies fever, SOB, rhinorrhea, nasal congestion. No asthma hx. Reports niece was sick recently with similar symptoms. Hx of allergies. She takes Singulair and zyrtec daily. covid vaccinated.  She took nyquil and went to sleep but still coughing.   Past Medical History:  Diagnosis Date   Allergy    Vaginal Pap smear, abnormal     Allergies  Allergen Reactions   Celexa [Citalopram Hydrobromide] Hives   Flonase [Fluticasone Propionate]     Nose bleeds         Observations/Objective:VITALS: Per patient if applicable, see vitals. GENERAL: Alert, appears well and in no acute distress. HEENT: Atraumatic, conjunctiva clear, no obvious abnormalities on inspection of external nose and ears. NECK: Normal movements of the head and neck. CARDIOPULMONARY: No increased WOB. Speaking in clear sentences. I:E ratio WNL.  MS: Moves all visible extremities without noticeable abnormality. PSYCH: Pleasant and cooperative, well-groomed. Speech normal  rate and rhythm. Affect is appropriate. Insight and judgement are appropriate. Attention is focused, linear, and appropriate.  NEURO: CN grossly intact. Oriented as arrived to appointment on time with no prompting. Moves both UE equally.  SKIN: No obvious lesions, wounds, erythema, or cyanosis noted on face or hands.     Assessment and Plan: Viral URI Treating the cough with Tessalon Perles as needed. Continue allergy medication   Follow Up Instructions: Follow-up in person for any continued or worsening problems.    I discussed the assessment and treatment plan with the patient. The patient was provided an opportunity to ask questions and all were answered. The patient agreed with the plan and demonstrated an understanding of the instructions.   The patient was advised to call back or seek an in-person evaluation if the symptoms worsen or if the condition fails to improve as anticipated.    [name], NP  02/18/2020 1:32 PM         Dahlia Byes A, NP 02/18/20 1332

## 2020-02-21 ENCOUNTER — Emergency Department
Admission: EM | Admit: 2020-02-21 | Discharge: 2020-02-21 | Disposition: A | Discharge status: Home / Self Care | Payer: 59 | Source: Home / Self Care

## 2020-02-21 ENCOUNTER — Other Ambulatory Visit: Payer: Self-pay

## 2020-02-21 DIAGNOSIS — R52 Pain, unspecified: Secondary | ICD-10-CM | POA: Diagnosis not present

## 2020-02-21 DIAGNOSIS — J0100 Acute maxillary sinusitis, unspecified: Secondary | ICD-10-CM

## 2020-02-21 MED ORDER — AMOXICILLIN-POT CLAVULANATE 875-125 MG PO TABS: 1.0000 | ORAL_TABLET | Freq: Two times a day (BID) | ORAL | 0 refills | Status: DC

## 2020-02-21 MED ORDER — PROMETHAZINE-DM 6.25-15 MG/5ML PO SYRP: 5.0000 mL | ORAL_SOLUTION | Freq: Four times a day (QID) | ORAL | 0 refills | Status: DC | PRN

## 2020-02-21 NOTE — ED Provider Notes (Signed)
[facility]    CSN: [id number] Arrival date & time: 02/21/20  0959      History   Chief Complaint Chief Complaint  Patient presents with  . Sore Throat  . Letter for School/Work    HPI Shelby Cook is a 34 y.o. female.   HPI Shelby Cook is a 34 y.o. female presenting to UC with c/o sore throat, nasal congestion and cough for about 1 week.  Cough keeps her up at night. Associated facial pain and pressure. She did a virtual visit last week, was prescribed tessalon but no relief. Denies fever, chills, n/v/d. Pt would like to be tested for Covid. She has not received her vaccine yet.  No known sick contacts.    Past Medical History:  Diagnosis Date  . Allergy   . Vaginal Pap smear, abnormal     Patient Active Problem List   Diagnosis Date Noted  . History of abnormal cervical Pap smear 04/19/2019  . Encounter for smoking cessation counseling 04/03/2018  . Need for rhogam due to Rh negative mother 11/19/2017  . Right knee pain 10/23/2015  . Abnormal weight gain 10/23/2015  . Morbid obesity ([facility]) 10/23/2015  . Environmental allergies 08/18/2015  . Seasonal allergies 08/04/2015  . Anxiety and depression 08/04/2015  . Multiple allergies 08/04/2015  . HEADACHE 07/25/2010  . 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 OBESITY NOS 02/16/2007  . ALLERGIC RHINITIS 02/16/2007  . OLIGOMENORRHEA 02/16/2007  . ACNE NEC 02/16/2007    Past Surgical History:  Procedure Laterality Date  . WISDOM TOOTH EXTRACTION      OB History    Gravida  1   Para      Term      Preterm      AB  1   Living        SAB  1   TAB      Ectopic      Multiple      Live Births               Home Medications    Prior to Admission medications   Medication Sig Start Date End Date Taking? Authorizing Provider  benzonatate (TESSALON) 100 MG capsule Take 1 capsule (100 mg total) by mouth every 8 (eight) hours. 02/18/20  Yes Bast,  Traci A, NP  amoxicillin-clavulanate (AUGMENTIN) 875-125 MG tablet Take 1 tablet by mouth 2 (two) times daily. One po bid x 7 days 02/21/20   Lurene Shadow, PA-C  diphenhydrAMINE (BENADRYL) 25 MG tablet Take 25 mg by mouth at bedtime.    [provider]  hydrOXYzine (ATARAX/VISTARIL) 25 MG tablet Take 1 tablet (25 mg total) by mouth every 6 (six) hours. 02/15/20   Darr, Veryl Speak, PA-C  loratadine (CLARITIN) 10 MG tablet Take 10 mg by mouth daily.    [provider]  montelukast (SINGULAIR) 10 MG tablet Take 1 tablet (10 mg total) by mouth at bedtime. 03/29/19   Breeback, Jade L, PA-C  promethazine-dextromethorphan (PROMETHAZINE-DM) 6.25-15 MG/5ML syrup Take 5 mLs by mouth 4 (four) times daily as needed for cough. 02/21/20   Lurene Shadow, PA-C  vortioxetine HBr (TRINTELLIX) 10 MG TABS tablet Take 1 tablet (10 mg total) by mouth daily. 12/24/19   Jomarie Longs, PA-C    Family History Family History  Problem Relation Age of Onset  . Drug abuse Father   . Mental illness Father        bipolar  . Diabetes  Father   . Hypertension Father   . Heart attack Father   . Osteoporosis Mother   . Scoliosis Sister   . Cancer Paternal Grandmother        breast and lung  . Diabetes Paternal Grandfather   . Heart attack Paternal Grandfather   . Allergies Sister     Social History Social History   Tobacco Use  . Smoking status: Current Some Day Smoker    Packs/day: 0.25    Types: Cigarettes  . Smokeless tobacco: Never Used  Substance Use Topics  . Alcohol use: Yes    Alcohol/week: 0.0 standard drinks  . Drug use: No     Allergies   Celexa [citalopram hydrobromide] and Flonase [fluticasone propionate]   Review of Systems Review of Systems  Constitutional: Negative for chills and fever.  HENT: Positive for congestion, sinus pressure, sinus pain and sore throat. Negative for ear pain, trouble swallowing and voice change.   Respiratory: Positive for cough. Negative for  shortness of breath.   Cardiovascular: Negative for chest pain and palpitations.  Gastrointestinal: Negative for abdominal pain, diarrhea, nausea and vomiting.  Musculoskeletal: Negative for arthralgias, back pain and myalgias.  Skin: Negative for rash.  Neurological: Positive for headaches.  All other systems reviewed and are negative.    Physical Exam Triage Vital Signs ED Triage Vitals  Enc Vitals Group     BP 02/21/20 1011 115/75     Pulse Rate 02/21/20 1011 76     Resp 02/21/20 1011 18     Temp 02/21/20 1011 98.4 F (36.9 C)     Temp Source 02/21/20 1011 Oral     SpO2 02/21/20 1011 97 %     Weight --      Height --      Head Circumference --      Peak Flow --      Pain Score 02/21/20 1009 4     Pain Loc --      Pain Edu? --      Excl. in [place]? --    No data found.  Updated Vital Signs BP 115/75 (BP Location: Right Arm)   Pulse 76   Temp 98.4 F (36.9 C) (Oral)   Resp 18   LMP 01/25/2020 (Exact Date) Comment: irregular baseline per pt  SpO2 97%   Visual Acuity Right Eye Distance:   Left Eye Distance:   Bilateral Distance:    Right Eye Near:   Left Eye Near:    Bilateral Near:     Physical Exam Vitals and nursing note reviewed.  Constitutional:      Appearance: She is well-developed.  HENT:     Head: Normocephalic and atraumatic.     Right Ear: Tympanic membrane and ear canal normal.     Left Ear: Tympanic membrane and ear canal normal.     Nose: Congestion present.     Right Sinus: Maxillary sinus tenderness present. No frontal sinus tenderness.     Left Sinus: Maxillary sinus tenderness present. No frontal sinus tenderness.     Mouth/Throat:     Lips: Pink.     Mouth: Mucous membranes are moist.     Pharynx: Oropharynx is clear. Uvula midline.  Cardiovascular:     Rate and Rhythm: Normal rate and regular rhythm.  Pulmonary:     Effort: Pulmonary effort is normal. No respiratory distress.     Breath sounds: Normal breath sounds. No stridor. No  wheezing, rhonchi or rales.  Musculoskeletal:  General: Normal range of motion.     Cervical back: Normal range of motion and neck supple.  Skin:    General: Skin is warm and dry.  Neurological:     Mental Status: She is alert and oriented to person, place, and time.  Psychiatric:        Behavior: Behavior normal.      UC Treatments / Results  Labs (all labs ordered are listed, but only abnormal results are displayed) Labs Reviewed  NOVEL CORONAVIRUS, NAA    EKG   Radiology No results found.  Procedures Procedures (including critical care time)  Medications Ordered in UC Medications - No data to display  Initial Impression / Assessment and Plan / UC Course  I have reviewed the triage vital signs and the nursing notes.  Pertinent labs & imaging results that were available during my care of the patient were reviewed by me and considered in my medical decision making (see chart for details).     Will tx for bacterial sinusitis given worsening symptoms and facial tenderness. Covid test pending Work note and AVS provided  Final Clinical Impressions(s) / UC Diagnoses   Final diagnoses:  Generalized body aches  Acute non-recurrent maxillary sinusitis     Discharge Instructions      You may take 500mg  acetaminophen every 4-6 hours or in combination with ibuprofen 400-600mg  every 6-8 hours as needed for pain, inflammation, and fever.  Be sure to well hydrated with clear liquids and get at least 8 hours of sleep at night, preferably more while sick.   Please follow up with family medicine in 1 week if needed.      ED Prescriptions    Medication Sig Dispense Auth. Provider   amoxicillin-clavulanate (AUGMENTIN) 875-125 MG tablet Take 1 tablet by mouth 2 (two) times daily. One po bid x 7 days 14 tablet Iain Sawchuk O, PA-C   promethazine-dextromethorphan (PROMETHAZINE-DM) 6.25-15 MG/5ML syrup Take 5 mLs by mouth 4 (four) times daily as needed for cough.  118 mL 11-18-1990, PA-C     PDMP not reviewed this encounter.   Lurene Shadow, Lurene Shadow 02/21/20 1901

## 2020-02-21 NOTE — ED Triage Notes (Signed)
Patient presents to Urgent Care with complaints of sore throat, cough, conestion since about a week ago. Patient reports she had a virtual visit last week and then her sx got worse, pt denies fevers.

## 2020-02-21 NOTE — Discharge Instructions (Signed)
  You may take 500mg acetaminophen every 4-6 hours or in combination with ibuprofen 400-600mg every 6-8 hours as needed for pain, inflammation, and fever.  Be sure to well hydrated with clear liquids and get at least 8 hours of sleep at night, preferably more while sick.   Please follow up with family medicine in 1 week if needed.   

## 2020-02-22 LAB — SARS-COV-2, NAA 2 DAY TAT

## 2020-02-22 LAB — NOVEL CORONAVIRUS, NAA: SARS-CoV-2, NAA: NOT DETECTED

## 2020-03-27 ENCOUNTER — Ambulatory Visit (INDEPENDENT_AMBULATORY_CARE_PROVIDER_SITE_OTHER): Payer: 59 | Admitting: Physician Assistant

## 2020-03-27 ENCOUNTER — Other Ambulatory Visit: Payer: Self-pay

## 2020-03-27 VITALS — BP 119/67 | HR 102 | Ht 67.0 in | Wt 268.0 lb

## 2020-03-27 DIAGNOSIS — L509 Urticaria, unspecified: Secondary | ICD-10-CM

## 2020-03-27 DIAGNOSIS — R519 Headache, unspecified: Secondary | ICD-10-CM | POA: Diagnosis not present

## 2020-03-27 DIAGNOSIS — G43111 Migraine with aura, intractable, with status migrainosus: Secondary | ICD-10-CM

## 2020-03-27 DIAGNOSIS — G479 Sleep disorder, unspecified: Secondary | ICD-10-CM

## 2020-03-27 DIAGNOSIS — F339 Major depressive disorder, recurrent, unspecified: Secondary | ICD-10-CM

## 2020-03-27 DIAGNOSIS — J3089 Other allergic rhinitis: Secondary | ICD-10-CM | POA: Diagnosis not present

## 2020-03-27 DIAGNOSIS — R29818 Other symptoms and signs involving the nervous system: Secondary | ICD-10-CM

## 2020-03-27 DIAGNOSIS — Z9109 Other allergy status, other than to drugs and biological substances: Secondary | ICD-10-CM

## 2020-03-27 DIAGNOSIS — Z889 Allergy status to unspecified drugs, medicaments and biological substances status: Secondary | ICD-10-CM

## 2020-03-27 DIAGNOSIS — F419 Anxiety disorder, unspecified: Secondary | ICD-10-CM

## 2020-03-27 MED ORDER — RIZATRIPTAN BENZOATE 10 MG PO TABS: 10.0000 mg | ORAL_TABLET | ORAL | 0 refills | Status: DC | PRN

## 2020-03-27 NOTE — Progress Notes (Signed)
Subjective:    Patient ID: Shelby Cook, female    DOB: 01-18-1986, 34 y.o.   MRN: [id number]  HPI  Pt is a 34 yo obese female with allergic rhinitis and environmental allergies, history of migraines who presents to the clinic with worsening headaches.   Patient is concerned with worsening headaches and migraine progression.  She has a history of headaches and migraines the used to be 1 or 2 a month now she is having at least a dull headache every day and migraine headaches once or twice per week.  She is having new neurological symptoms with her eyes becoming really blurry.  She also gets nauseated and feels the need to get out of light and sleep. Tylenol/ibuprofen and sleep do seem to help. She has no rescue medication.  She is most concerned because she feels a little disoriented when these migraines occur.  She has never felt like that before.  She almost feels like she cannot formulate words.  She denies any upper or lower extremity weakness.  All headaches go on her left side more towards the back and radiate into neck.   She denies any current sinusitis symptoms.  She was treated for sinus infection on 02/21/2020  She has been having intermittent hives with no etiology.  She has significant environmental and seasonal and nonseasonal allergies.  She takes Singulair every day.  She also had been taking Zyrtec after Claritin had not been working.  She continues to get these hives at work more over her chest arms and neck.  They are very itchy.  Benadryl seems to help the most to get them to go down.  She has an appointment with allergist in the next month.  She does have some reflux symptoms that have been ongoing but nothing has worsened.  She has had some intermittent loose stools they seem to be improving.   She is having problems going to sleep. Benadryl helps but then wakes up a few hours later and cannot go back to sleep.   She does feel like her anxiety and depression have not  improved.  She had gotten some significant improvement with 5 mg of Trintellix but we moved up to 10 mg no improvement.  .. Active Ambulatory Problems    Diagnosis Date Noted  . OBESITY NOS 02/16/2007  . ALLERGIC RHINITIS 02/16/2007  . OLIGOMENORRHEA 02/16/2007  . 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 HEADACHE 07/25/2010  . 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 Seasonal allergies 08/04/2015  . Anxiety and depression 08/04/2015  . Multiple allergies 08/04/2015  . Environmental allergies 08/18/2015  . Right knee pain 10/23/2015  . Abnormal weight gain 10/23/2015  . Morbid obesity (HCC) 10/23/2015  . Need for rhogam due to Rh negative mother 11/19/2017  . Encounter for smoking cessation counseling 04/03/2018  . History of abnormal cervical Pap smear 04/19/2019   Resolved Ambulatory Problems    Diagnosis Date Noted  . No Resolved Ambulatory Problems   Past Medical History:  Diagnosis Date  . Allergy   . Vaginal Pap smear, abnormal      Review of Systems  All other systems reviewed and are negative.      Objective:   Physical Exam Vitals reviewed.  Constitutional:      Appearance: She is well-developed. She is obese.  HENT:     Head: Normocephalic.     Mouth/Throat:     Mouth: Mucous membranes are moist.  Eyes:     Extraocular Movements:  Extraocular movements intact.     Pupils: Pupils are equal, round, and reactive to light.  Cardiovascular:     Rate and Rhythm: Normal rate and regular rhythm.  Pulmonary:     Effort: Pulmonary effort is normal.     Breath sounds: Normal breath sounds.  Musculoskeletal:     Cervical back: Normal range of motion.  Lymphadenopathy:     Cervical: No cervical adenopathy.  Neurological:     Mental Status: She is alert and oriented to person, place, and time.  Psychiatric:        Mood and Affect: Mood normal.       .. Depression screen [facility] 2/9 12/24/2019 03/29/2019 10/07/2018 04/03/2018 11/01/2016  Decreased Interest  0 0 1 1 2   Down, Depressed, Hopeless 2 1 2 1 2   PHQ - 2 Score 2 1 3 2 4   Altered sleeping 3 1 2 1 2   Tired, decreased energy 3 1 1 1 1   Change in appetite 2 2 1 1 2   Feeling bad or failure about yourself  2 1 1 1 2   Trouble concentrating 0 0 0 0 2  Moving slowly or fidgety/restless 0 0 0 0 0  Suicidal thoughts 0 0 0 0 1  PHQ-9 Score 12 6 8 6 14   Difficult doing work/chores Somewhat difficult Somewhat difficult Somewhat difficult Somewhat difficult -   .. GAD 7 : Generalized Anxiety Score 12/24/2019 03/29/2019 10/07/2018 04/03/2018  Nervous, Anxious, on Edge 1 1 2 2   Control/stop worrying 1 1 2 2   Worry too much - different things 1 1 1 1   Trouble relaxing 2 1 1 2   Restless 2 0 0 1  Easily annoyed or irritable 2 1 3 2   Afraid - awful might happen 1 1 3  0  Total GAD 7 Score 10 6 12 10   Anxiety Difficulty Somewhat difficult Somewhat difficult Somewhat difficult Somewhat difficult        Assessment & Plan:   Shelby Cook was seen today for headache.  Diagnoses and all orders for this visit:  Worsening headaches -     rizatriptan (MAXALT) 10 MG tablet; Take 1 tablet (10 mg total) by mouth as needed for migraine. May repeat in 2 hours if needed  Morbid obesity (HCC)  Hives  Non-seasonal allergic rhinitis, unspecified trigger  Environmental allergies  Multiple allergies  Anxiety  Depression, recurrent (HCC)  Intractable migraine with aura with status migrainosus -     rizatriptan (MAXALT) 10 MG tablet; Take 1 tablet (10 mg total) by mouth as needed for migraine. May repeat in 2 hours if needed   Unclear etiology of worsening headaches. Pt is not late for menstrual cycle.  Am concerned with the new neurological changes with vision and disorientation.  Would like to get imaging of the brain.  I did go ahead and give patient a rescue medication to start at onset of migraine.  Discussed side effects.  She would qualify for preventative therapy but we will wait till imaging is  returned.  She does have an appointment with allergist.  Will be interesting to see if there is some correlation with these hives and allergies worsening her migraines.  Stop Zyrtec and try Allegra to see if she gets any better benefit.  For sleep could try Unisom at night.  Encourage good sleep routine.keep migraine diary.   Consider increasing trintellix in future due to elevated PHq and GAD-7 scores.   Follow up in one month.

## 2020-03-27 NOTE — Patient Instructions (Addendum)
unisom  Will get imaging.   Migraine Headache A migraine headache is an intense, throbbing pain on one side or both sides of the head. Migraine headaches may also cause other symptoms, such as nausea, vomiting, and sensitivity to light and noise. A migraine headache can last from 4 hours to 3 days. Talk with your doctor about what things may bring on (trigger) your migraine headaches. What are the causes? The exact cause of this condition is not known. However, a migraine may be caused when nerves in the brain become irritated and release chemicals that cause inflammation of blood vessels. This inflammation causes pain. This condition may be triggered or caused by:  Drinking alcohol.  Smoking.  Taking medicines, such as: ? Medicine used to treat chest pain (nitroglycerin). ? Birth control pills. ? Estrogen. ? Certain blood pressure medicines.  Eating or drinking products that contain nitrates, glutamate, aspartame, or tyramine. Aged cheeses, chocolate, or caffeine may also be triggers.  Doing physical activity. Other things that may trigger a migraine headache include:  Menstruation.  Pregnancy.  Hunger.  Stress.  Lack of sleep or too much sleep.  Weather changes.  Fatigue. What increases the risk? The following factors may make you more likely to experience migraine headaches:  Being a certain age. This condition is more common in people who are 64-62 years old.  Being female.  Having a family history of migraine headaches.  Being Caucasian.  Having a mental health condition, such as depression or anxiety.  Being obese. What are the signs or symptoms? The main symptom of this condition is pulsating or throbbing pain. This pain may:  Happen in any area of the head, such as on one side or both sides.  Interfere with daily activities.  Get worse with physical activity.  Get worse with exposure to bright lights or loud noises. Other symptoms may  include:  Nausea.  Vomiting.  Dizziness.  General sensitivity to bright lights, loud noises, or smells. Before you get a migraine headache, you may get warning signs (an aura). An aura may include:  Seeing flashing lights or having blind spots.  Seeing bright spots, halos, or zigzag lines.  Having tunnel vision or blurred vision.  Having numbness or a tingling feeling.  Having trouble talking.  Having muscle weakness. Some people have symptoms after a migraine headache (postdromal phase), such as:  Feeling tired.  Difficulty concentrating. How is this diagnosed? A migraine headache can be diagnosed based on:  Your symptoms.  A physical exam.  Tests, such as: ? CT scan or an MRI of the head. These imaging tests can help rule out other causes of headaches. ? Taking fluid from the spine (lumbar puncture) and analyzing it (cerebrospinal fluid analysis, or CSF analysis). How is this treated? This condition may be treated with medicines that:  Relieve pain.  Relieve nausea.  Prevent migraine headaches. Treatment for this condition may also include:  Acupuncture.  Lifestyle changes like avoiding foods that trigger migraine headaches.  Biofeedback.  Cognitive behavioral therapy. Follow these instructions at home: Medicines  Take over-the-counter and prescription medicines only as told by your health care provider.  Ask your health care provider if the medicine prescribed to you: ? Requires you to avoid driving or using heavy machinery. ? Can cause constipation. You may need to take these actions to prevent or treat constipation:  Drink enough fluid to keep your urine pale yellow.  Take over-the-counter or prescription medicines.  Eat foods that are high in  fiber, such as beans, whole grains, and fresh fruits and vegetables.  Limit foods that are high in fat and processed sugars, such as fried or sweet foods. Lifestyle  Do not drink alcohol.  Do not  use any products that contain nicotine or tobacco, such as cigarettes, e-cigarettes, and chewing tobacco. If you need help quitting, ask your health care provider.  Get at least 8 hours of sleep every night.  Find ways to manage stress, such as meditation, deep breathing, or yoga. General instructions      Keep a journal to find out what may trigger your migraine headaches. For example, write down: ? What you eat and drink. ? How much sleep you get. ? Any change to your diet or medicines.  If you have a migraine headache: ? Avoid things that make your symptoms worse, such as bright lights. ? It may help to lie down in a dark, quiet room. ? Do not drive or use heavy machinery. ? Ask your health care provider what activities are safe for you while you are experiencing symptoms.  Keep all follow-up visits as told by your health care provider. This is important. Contact a health care provider if:  You develop symptoms that are different or more severe than your usual migraine headache symptoms.  You have more than 15 headache days in one month. Get help right away if:  Your migraine headache becomes severe.  Your migraine headache lasts longer than 72 hours.  You have a fever.  You have a stiff neck.  You have vision loss.  Your muscles feel weak or like you cannot control them.  You start to lose your balance often.  You have trouble walking.  You faint.  You have a seizure. Summary  A migraine headache is an intense, throbbing pain on one side or both sides of the head. Migraines may also cause other symptoms, such as nausea, vomiting, and sensitivity to light and noise.  This condition may be treated with medicines and lifestyle changes. You may also need to avoid certain things that trigger a migraine headache.  Keep a journal to find out what may trigger your migraine headaches.  Contact your health care provider if you have more than 15 headache days in a  month or you develop symptoms that are different or more severe than your usual migraine headache symptoms. This information is not intended to replace advice given to you by your health care provider. Make sure you discuss any questions you have with your health care provider. Document Revised: 12/25/2018 Document Reviewed: 10/15/2018 Elsevier Patient Education  2020 [employer].

## 2020-03-27 NOTE — Progress Notes (Signed)
Headaches off/on for years Past two weeks worse Sometimes dull/throbbing Sometimes sharp pains, blurry vision, nausea/vomiting (1 episode) Makes her feel like she is talking/moving slow

## 2020-03-28 ENCOUNTER — Encounter: Payer: Self-pay | Admitting: Physician Assistant

## 2020-03-28 DIAGNOSIS — G479 Sleep disorder, unspecified: Secondary | ICD-10-CM | POA: Insufficient documentation

## 2020-03-28 DIAGNOSIS — R519 Headache, unspecified: Secondary | ICD-10-CM | POA: Insufficient documentation

## 2020-03-28 DIAGNOSIS — L509 Urticaria, unspecified: Secondary | ICD-10-CM | POA: Insufficient documentation

## 2020-03-28 DIAGNOSIS — G43111 Migraine with aura, intractable, with status migrainosus: Secondary | ICD-10-CM | POA: Insufficient documentation

## 2020-03-28 DIAGNOSIS — F339 Major depressive disorder, recurrent, unspecified: Secondary | ICD-10-CM | POA: Insufficient documentation

## 2020-04-01 ENCOUNTER — Other Ambulatory Visit: Payer: Self-pay

## 2020-04-01 ENCOUNTER — Ambulatory Visit (INDEPENDENT_AMBULATORY_CARE_PROVIDER_SITE_OTHER): Payer: 59

## 2020-04-01 DIAGNOSIS — R29818 Other symptoms and signs involving the nervous system: Secondary | ICD-10-CM | POA: Diagnosis not present

## 2020-04-03 NOTE — Progress Notes (Signed)
MRI normal and no cause of headaches seen. If continue to have headaches that are frequent we can discuss preventative. Continue to use maxalt for rescue.   Lesly Rubenstein

## 2020-04-17 ENCOUNTER — Encounter: Payer: Self-pay | Admitting: Physician Assistant

## 2020-04-18 ENCOUNTER — Other Ambulatory Visit: Payer: Self-pay | Admitting: Physician Assistant

## 2020-04-18 DIAGNOSIS — Z889 Allergy status to unspecified drugs, medicaments and biological substances status: Secondary | ICD-10-CM

## 2020-04-18 DIAGNOSIS — Z9109 Other allergy status, other than to drugs and biological substances: Secondary | ICD-10-CM

## 2020-05-08 ENCOUNTER — Encounter: Payer: Self-pay | Admitting: Physician Assistant

## 2020-05-28 ENCOUNTER — Other Ambulatory Visit: Payer: Self-pay | Admitting: Physician Assistant

## 2020-05-28 DIAGNOSIS — G43111 Migraine with aura, intractable, with status migrainosus: Secondary | ICD-10-CM

## 2020-05-28 DIAGNOSIS — R519 Headache, unspecified: Secondary | ICD-10-CM

## 2020-06-01 ENCOUNTER — Encounter: Payer: Self-pay | Admitting: Physician Assistant

## 2020-06-01 DIAGNOSIS — F339 Major depressive disorder, recurrent, unspecified: Secondary | ICD-10-CM

## 2020-08-16 ENCOUNTER — Emergency Department (INDEPENDENT_AMBULATORY_CARE_PROVIDER_SITE_OTHER)
Admission: RE | Admit: 2020-08-16 | Discharge: 2020-08-16 | Disposition: A | Discharge status: Home / Self Care | Payer: 59 | Source: Ambulatory Visit

## 2020-08-16 ENCOUNTER — Other Ambulatory Visit: Payer: Self-pay

## 2020-08-16 VITALS — BP 113/72 | HR 87 | Temp 99.6°F | Ht 67.0 in | Wt 275.0 lb

## 2020-08-16 DIAGNOSIS — J0100 Acute maxillary sinusitis, unspecified: Secondary | ICD-10-CM

## 2020-08-16 MED ORDER — AMOXICILLIN-POT CLAVULANATE 875-125 MG PO TABS: 1.0000 | ORAL_TABLET | Freq: Two times a day (BID) | ORAL | 0 refills | Status: AC

## 2020-08-16 NOTE — ED Provider Notes (Signed)
[facility]    CSN: [id number] Arrival date & time: 08/16/20  1456      History   Chief Complaint Chief Complaint  Patient presents with  . Nasal Congestion    HPI Shelby Cook is a 34 y.o. female.   The history is provided by the patient. No language interpreter was used.  Cough Cough characteristics:  Unable to specify Sputum characteristics:  Nondescript Severity:  Moderate Onset quality:  Gradual Timing:  Constant Context: not sick contacts   Relieved by:  Nothing Pt reports she has frequent sinus infections.  Pt reports symptoms started 5 days ago   Past Medical History:  Diagnosis Date  . Allergy   . Vaginal Pap smear, abnormal     Patient Active Problem List   Diagnosis Date Noted  . Hives 03/28/2020  . Depression, recurrent (HCC) 03/28/2020  . Worsening headaches 03/28/2020  . Intractable migraine with aura with status migrainosus 03/28/2020  . Trouble in sleeping 03/28/2020  . History of abnormal cervical Pap smear 04/19/2019  . Encounter for smoking cessation counseling 04/03/2018  . Need for rhogam due to Rh negative mother 11/19/2017  . Abnormal weight gain 10/23/2015  . Morbid obesity (HCC) 10/23/2015  . Environmental allergies 08/18/2015  . Seasonal allergies 08/04/2015  . Anxiety 08/04/2015  . Multiple allergies 08/04/2015  . HEADACHE 07/25/2010  . 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 Allergic rhinitis 02/16/2007  . OLIGOMENORRHEA 02/16/2007  . ACNE NEC 02/16/2007    Past Surgical History:  Procedure Laterality Date  . WISDOM TOOTH EXTRACTION      OB History    Gravida  1   Para      Term      Preterm      AB  1   Living        SAB  1   TAB      Ectopic      Multiple      Live Births               Home Medications    Prior to Admission medications   Medication Sig Start Date End Date Taking? Authorizing Provider  amoxicillin-clavulanate (AUGMENTIN) 875-125  MG tablet Take 1 tablet by mouth every 12 (twelve) hours for 10 days. 08/16/20 08/26/20  Elson Areas, PA-C  diphenhydrAMINE (BENADRYL) 25 MG tablet Take 25 mg by mouth at bedtime.    [provider]  loratadine (CLARITIN) 10 MG tablet Take 10 mg by mouth daily.    [provider]  montelukast (SINGULAIR) 10 MG tablet TAKE 1 TABLET BY MOUTH EVERYDAY AT BEDTIME 04/18/20   Breeback, Jade L, PA-C  rizatriptan (MAXALT) 10 MG tablet TAKE 1 TABLET BY MOUTH AS NEEDED FOR MIGRAINE. MAY REPEAT IN 2 HOURS IF NEEDED 05/29/20   Breeback, Jade L, PA-C  vortioxetine HBr (TRINTELLIX) 10 MG TABS tablet Take 1 tablet (10 mg total) by mouth daily. 12/24/19   Jomarie Longs, PA-C    Family History Family History  Problem Relation Age of Onset  . Drug abuse Father   . Mental illness Father        bipolar  . Diabetes Father   . Hypertension Father   . Heart attack Father   . Osteoporosis Mother   . Scoliosis Sister   . Cancer Paternal Grandmother        breast and lung  . Diabetes Paternal Grandfather   . Heart attack Paternal Grandfather   .  Allergies Sister     Social History Social History   Tobacco Use  . Smoking status: Current Some Day Smoker    Packs/day: 0.25    Types: Cigarettes  . Smokeless tobacco: Never Used  Vaping Use  . Vaping Use: Every day  . Substances: Nicotine, Flavoring  Substance Use Topics  . Alcohol use: Yes    Alcohol/week: 0.0 standard drinks  . Drug use: No     Allergies   Celexa [citalopram hydrobromide] and Flonase [fluticasone propionate]   Review of Systems Review of Systems  Respiratory: Positive for cough.   All other systems reviewed and are negative.    Physical Exam Triage Vital Signs ED Triage Vitals [08/16/20 1510]  Enc Vitals Group     BP 113/72     Pulse Rate 87     Resp      Temp 99.6 F (37.6 C)     Temp Source Oral     SpO2 100 %     Weight 275 lb (124.7 kg)     Height 5\' 7"  (1.702 m)     Head Circumference       Peak Flow      Pain Score 0     Pain Loc      Pain Edu?      Excl. in GC?    No data found.  Updated Vital Signs BP 113/72 (BP Location: Right Arm)   Pulse 87   Temp 99.6 F (37.6 C) (Oral)   Ht 5\' 7"  (1.702 m)   Wt 124.7 kg   LMP 04/16/2020 (Approximate)   SpO2 100%   BMI 43.07 kg/m   Visual Acuity Right Eye Distance:   Left Eye Distance:   Bilateral Distance:    Right Eye Near:   Left Eye Near:    Bilateral Near:     Physical Exam Vitals and nursing note reviewed.  Constitutional:      Appearance: She is well-developed.  HENT:     Head: Normocephalic.     Right Ear: Tympanic membrane normal.     Left Ear: Tympanic membrane normal.     Nose: Nose normal.     Mouth/Throat:     Mouth: Mucous membranes are moist.     Comments: Tender maxillary sinuses  Cardiovascular:     Rate and Rhythm: Normal rate.  Pulmonary:     Effort: Pulmonary effort is normal.  Abdominal:     General: There is no distension.  Musculoskeletal:        General: Normal range of motion.     Cervical back: Normal range of motion.  Neurological:     Mental Status: She is alert and oriented to person, place, and time.      UC Treatments / Results  Labs (all labs ordered are listed, but only abnormal results are displayed) Labs Reviewed  COVID-19, FLU A+B AND RSV    EKG   Radiology No results found.  Procedures Procedures (including critical care time)  Medications Ordered in UC Medications - No data to display  Initial Impression / Assessment and Plan / UC Course  I have reviewed the triage vital signs and the nursing notes.  Pertinent labs & imaging results that were available during my care of the patient were reviewed by me and considered in my medical decision making (see chart for details).     MDM:  Covid pending.  Pt advised to wait for covid test before starting augmentin Final Clinical Impressions(s) /  UC Diagnoses   Final diagnoses:  Acute non-recurrent  maxillary sinusitis     Discharge Instructions     Your covid is pending.  If you have covid you do not need to start Augmentin.  Follow up with your provider for recheck    ED Prescriptions    Medication Sig Dispense Auth. Provider   amoxicillin-clavulanate (AUGMENTIN) 875-125 MG tablet Take 1 tablet by mouth every 12 (twelve) hours for 10 days. 20 tablet Elson Areas, [place]     PDMP not reviewed this encounter.  An After Visit Summary was printed and given to the patient.   Elson Areas, [place] 08/16/20 1705

## 2020-08-16 NOTE — ED Triage Notes (Signed)
Scratchy throat, nasal congestion, sinus pain and pressure, body aches, fatigue, cough x 4 days. Vaccinated

## 2020-08-16 NOTE — Discharge Instructions (Signed)
Your covid is pending.  If you have covid you do not need to start Augmentin.  Follow up with your provider for recheck

## 2020-08-18 LAB — COVID-19, FLU A+B AND RSV
Influenza A, NAA: NOT DETECTED
Influenza B, NAA: NOT DETECTED
RSV, NAA: NOT DETECTED
SARS-CoV-2, NAA: NOT DETECTED

## 2020-08-21 ENCOUNTER — Other Ambulatory Visit: Payer: Self-pay | Admitting: Physician Assistant

## 2020-08-21 DIAGNOSIS — F419 Anxiety disorder, unspecified: Secondary | ICD-10-CM

## 2020-08-25 ENCOUNTER — Encounter: Payer: Self-pay | Admitting: Physician Assistant

## 2020-08-25 DIAGNOSIS — G43111 Migraine with aura, intractable, with status migrainosus: Secondary | ICD-10-CM

## 2020-08-25 DIAGNOSIS — R519 Headache, unspecified: Secondary | ICD-10-CM

## 2020-08-25 MED ORDER — RIZATRIPTAN BENZOATE 10 MG PO TABS: ORAL_TABLET | ORAL | 0 refills | Status: DC

## 2020-08-28 ENCOUNTER — Encounter: Payer: Self-pay | Admitting: Physician Assistant

## 2020-08-28 ENCOUNTER — Ambulatory Visit (INDEPENDENT_AMBULATORY_CARE_PROVIDER_SITE_OTHER): Payer: 59 | Admitting: Physician Assistant

## 2020-08-28 VITALS — BP 132/77 | HR 96 | Ht 67.0 in | Wt 270.0 lb

## 2020-08-28 DIAGNOSIS — Z13220 Encounter for screening for lipoid disorders: Secondary | ICD-10-CM

## 2020-08-28 DIAGNOSIS — Z131 Encounter for screening for diabetes mellitus: Secondary | ICD-10-CM

## 2020-08-28 DIAGNOSIS — F32A Depression, unspecified: Secondary | ICD-10-CM

## 2020-08-28 DIAGNOSIS — Z139 Encounter for screening, unspecified: Secondary | ICD-10-CM

## 2020-08-28 DIAGNOSIS — E538 Deficiency of other specified B group vitamins: Secondary | ICD-10-CM

## 2020-08-28 DIAGNOSIS — F419 Anxiety disorder, unspecified: Secondary | ICD-10-CM

## 2020-08-28 DIAGNOSIS — Z23 Encounter for immunization: Secondary | ICD-10-CM

## 2020-08-28 DIAGNOSIS — Z3141 Encounter for fertility testing: Secondary | ICD-10-CM

## 2020-08-28 DIAGNOSIS — Z3169 Encounter for other general counseling and advice on procreation: Secondary | ICD-10-CM

## 2020-08-28 MED ORDER — VORTIOXETINE HBR 10 MG PO TABS: ORAL_TABLET | ORAL | 5 refills | Status: DC

## 2020-08-28 NOTE — Progress Notes (Addendum)
Established Patient Office Visit  Subjective:  Patient ID: Shelby Cook, female    DOB: 08/30/1986  Age: 34 y.o. MRN: [id number]  CC:   HPI Shelby Cook is a pleasant 34 yo obese female who presents for concerns of early menopause and wanting to discuss getting pregnant. She is concerned for early menopause because her periods are irregular and she has not been able to get pregnant. She had one in august and then on her period right now.  PMH of fatigue, B12 deficiency, obesity, migraine, abnormal PAP, anxiety, depression.  Patient had a miscarriage two years ago with no pregnancies since. Patient has been off birth control and actively trying to get pregnant for last two years.  Patient reports that she does not get a period every month. LMP started started on 08/24/2020. Prior to that she had not had a menstrual period since August 2021. Pt reports severe cramps during menstrual that she takes tylenol/ibuprofen for with relief.  Diet & Exercise: exercise is "lacking" according to pt, diet is ok (tuna/crackers for lunch, water, coffee, skips breakfast, pasta/steak/grilled chicken for dinner) Sleep: trouble falling asleep and staying asleep, tried unisom which helped with falling asleep but not staying asleep Mood: taking trintellix which helps, pt has anxiety and says this is a hard time of year (Dad passed on September 20, 2014) in addition to recent passing of grandmother Smoking/Drug Use: vape (1 cartridge every 6-8 weeks), cigarettes occasionally if very stressed out Headaches: maxalt for rescue which works 60% of time, migraines occur twice/month  Last CPE was 04/19/2019 with Tandy Gaw  B12 deficiency - taking supplement  Hypoalbuminemia  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  Past Medical History:  Diagnosis Date  . Allergy   . Vaginal Pap smear, abnormal     Past Surgical History:  Procedure  Laterality Date  . WISDOM TOOTH EXTRACTION      Family History  Problem Relation Age of Onset  . Drug abuse Father   . Mental illness Father        bipolar  . Diabetes Father   . Hypertension Father   . Heart attack Father   . Osteoporosis Mother   . Scoliosis Sister   . Cancer Paternal Grandmother        breast and lung  . Diabetes Paternal Grandfather   . Heart attack Paternal Grandfather   . Allergies Sister      Outpatient Medications Prior to Visit  Medication Sig Dispense Refill  . diphenhydrAMINE (BENADRYL) 25 MG tablet Take 25 mg by mouth at bedtime.    Marland Kitchen loratadine (CLARITIN) 10 MG tablet Take 10 mg by mouth daily.    . montelukast (SINGULAIR) 10 MG tablet TAKE 1 TABLET BY MOUTH EVERYDAY AT BEDTIME 90 tablet 1  . rizatriptan (MAXALT) 10 MG tablet TAKE 1 TABLET BY MOUTH AS NEEDED FOR MIGRAINE. MAY REPEAT IN 2 HOURS IF NEEDED 10 tablet 0  . vortioxetine HBr (TRINTELLIX) 10 MG TABS tablet Take 1 tablet (10 mg total) by mouth daily. Appt for further refills 30 tablet 0   No facility-administered medications prior to visit.    Allergies  Allergen Reactions  . Celexa [Citalopram Hydrobromide] Hives  . Flonase [Fluticasone Propionate]     Nose bleeds    ROS Review of Systems  Constitutional: Negative for activity change and appetite change.  Respiratory: Negative for cough and shortness of breath.  Gastrointestinal: Negative for nausea and vomiting.  Genitourinary: Positive for menstrual problem (dysmenorrhea). Negative for pelvic pain, vaginal bleeding and vaginal pain.  Neurological: Positive for headaches (2 migraines/month). Negative for syncope and light-headedness.  Psychiatric/Behavioral: Positive for sleep disturbance. The patient is nervous/anxious.      Objective:    Physical Exam Vitals reviewed.  Constitutional:      Appearance: Normal appearance. She is obese.  Neck:     Comments: No thyroid enlargement.  Cardiovascular:     Rate and Rhythm:  Normal rate and regular rhythm.     Pulses: Normal pulses.  Pulmonary:     Effort: Pulmonary effort is normal.     Breath sounds: Normal breath sounds.  Abdominal:     General: Bowel sounds are normal.     Palpations: Abdomen is soft.     Tenderness: There is no abdominal tenderness.  Lymphadenopathy:     Cervical: No cervical adenopathy.  Neurological:     General: No focal deficit present.     Mental Status: She is alert.  Psychiatric:        Mood and Affect: Mood normal.     BP 132/77   Pulse 96   Ht 5\' 7"  (1.702 m)   Wt 270 lb (122.5 kg)   SpO2 96%   BMI 42.29 kg/m  Wt Readings from Last 3 Encounters:  08/28/20 270 lb (122.5 kg)  08/16/20 275 lb (124.7 kg)  03/27/20 268 lb (121.6 kg)    Health Maintenance Due  Topic Date Due  . PAP SMEAR-Modifier  04/18/2020   .06/18/2020 Depression screen [facility] 2/9 08/28/2020 03/27/2020 12/24/2019 03/29/2019 10/07/2018  Decreased Interest 1 1 0 0 1  Down, Depressed, Hopeless 1 2 2 1 2   PHQ - 2 Score 2 3 2 1 3   Altered sleeping 2 2 3 1 2   Tired, decreased energy 1 2 3 1 1   Change in appetite 1 1 2 2 1   Feeling bad or failure about yourself  1 0 2 1 1   Trouble concentrating 0 3 0 0 0  Moving slowly or fidgety/restless 0 2 0 0 0  Suicidal thoughts 0 0 0 0 0  PHQ-9 Score 7 13 12 6 8   Difficult doing work/chores Very difficult Very difficult Somewhat difficult Somewhat difficult Somewhat difficult   .10/09/2018 GAD 7 : Generalized Anxiety Score 08/28/2020 03/27/2020 12/24/2019 03/29/2019  Nervous, Anxious, on Edge 1 1 1 1   Control/stop worrying 1 1 1 1   Worry too much - different things 1 1 1 1   Trouble relaxing 1 1 2 1   Restless 0 1 2 0  Easily annoyed or irritable 2 2 2 1   Afraid - awful might happen 2 2 1 1   Total GAD 7 Score 8 9 10 6   Anxiety Difficulty Somewhat difficult Very difficult Somewhat difficult Somewhat difficult       Assessment & Plan:   Shelby Cook was seen today for follow-up.  Diagnoses and all orders for this  visit:  Screening due -     CBC w/Diff/Platelet  Morbid obesity (HCC) -     TSH -     COMPLETE METABOLIC PANEL WITH GFR  Screening for lipid disorders -     Lipid Panel w/reflex Direct LDL  Screening for diabetes mellitus -     COMPLETE METABOLIC PANEL WITH GFR  B12 deficiency -     B12  Fertility testing -     FSH/LH  Flu vaccine need -     Flu  Vaccine QUAD 36+ mos IM  Infertility counseling -     FSH/LH -     CP Testosterone, BIO-Female/Children -     DHEA-sulfate -     Ambulatory referral to Obstetrics / Gynecology  Anxiety and depression -     vortioxetine HBr (TRINTELLIX) 10 MG TABS tablet; Take one and one-half tablet daily.    Infertility - 2 years of unprotected sex with no pregnancy - Check labs: FSH/LH, TSH, DHEA, testosterone.  Health Maintenance - Flu shot given in right deltoid today Mood - Continue Trintellix. Stable PHQ/GAD. Migraines - gave nurtec sample to try to replace maxalt to see if rescues migraines better.  HPV on last Pap - Patient instructed to schedule appt for pap smear    Follow-up: Return in about 4 months (around 12/27/2020).   Marland KitchenHarlon Flor PA-C, have reviewed and agree with the above documentation in it's entirety.

## 2020-08-28 NOTE — Patient Instructions (Signed)
Female Infertility  Female infertility refers to a woman's inability to get pregnant (conceive) after a year of having sex regularly (or after 6 months in women over age 32) without using birth control. Infertility can also mean that a woman is not able to carry a pregnancy to full term. Both women and men can have fertility problems. What are the causes? This condition may be caused by:  Problems with reproductive organs. Infertility can result if a woman: ? Has an abnormally short cervix or a cervix that does not remain closed during a pregnancy. ? Has a blockage or scarring in the fallopian tubes. ? Has an abnormally shaped uterus. ? Has uterine fibroids. This is a benign mass of tissue or muscle (tumor) that can develop in the uterus. ? Is not ovulating in a regular way.  Certain medical conditions. These may include: ? Polycystic ovary syndrome (PCOS). This is a hormonal disorder that can cause small cysts to grow on the ovaries. This is the most common cause of infertility in women. ? Endometriosis. This is a condition in which the tissue that lines the uterus (endometrium) grows outside of its normal location. ? Cancer and cancer treatments, such as chemotherapy or radiation. ? Premature ovarian failure. This is when ovaries stop producing eggs and hormones before age 40. ? Sexually transmitted diseases, such as chlamydia or gonorrhea. ? Autoimmune disorders. These are disorders in which the body's defense system (immune system) attacks normal, healthy cells. Infertility can be linked to more than one cause. For some women, the cause of infertility is not known (unexplained infertility). What increases the risk?  Age. A woman's fertility declines with age, especially after her mid-30s.  Being underweight or overweight.  Drinking too much alcohol.  Using drugs such as anabolic steroids, cocaine, and marijuana.  Exercising excessively.  Being exposed to environmental toxins,  such as radiation, pesticides, and certain chemicals. What are the signs or symptoms? The main sign of infertility in women is the inability to get pregnant or carry a pregnancy to full term. How is this diagnosed? This condition may be diagnosed by:  Checking whether you are ovulating each month. The tests may include: ? Blood tests to check hormone levels. ? An ultrasound of the ovaries. ? Taking a small tissue that lines the uterus and checking it under a microscope (endometrial biopsy).  Doing additional tests. This is done if ovulation is normal. Tests may include: ? Hysterosalpingography. This X-ray test can show the shape of the uterus and whether the fallopian tubes are open. ? Laparoscopy. This test uses a lighted tube (laparoscope) to look for problems in the fallopian tubes and other organs. ? Transvaginal ultrasound. This imaging test is used to check for abnormalities in the uterus and ovaries. ? Hysteroscopy. This test uses a lighted tube to check for problems in the cervix and the uterus. To be diagnosed with infertility, both partners will have a physical exam. Both partners will also have an extensive medical and sexual history taken. Additional tests may be done. How is this treated? Treatment depends on the cause of infertility. Most cases of infertility in women are treated with medicine or surgery.  Women may take medicine to: ? Correct ovulation problems. ? Treat other health conditions.  Surgery may be done to: ? Repair damage to the ovaries, fallopian tubes, cervix, or uterus. ? Remove growths from the uterus. ? Remove scar tissue from the uterus, pelvis, or other organs. Assisted reproductive technology (ART) Assisted reproductive technology (  ART) refers to all treatments and procedures that combine eggs and sperm outside the body to try to help a couple conceive. ART is often combined with fertility drugs to stimulate ovulation. Sometimes ART is done using eggs  retrieved from another woman's body (donor eggs) or from previously frozen fertilized eggs (embryos). There are different types of ART. These include:  Intrauterine insemination (IUI). A long, thin tube is used to place sperm directly into a woman's uterus. This procedure: ? Is effective for infertility caused by sperm problems, including low sperm count and low motility. ? Can be used in combination with fertility drugs.  In vitro fertilization (IVF). This is done when a woman's fallopian tubes are blocked or when a man has low sperm count. In this procedure: ? Fertility drugs are used to stimulate the ovaries to produce multiple eggs. ? Once mature, these eggs are removed from the body and combined with the sperm to be fertilized. ? The fertilized eggs are then placed into the woman's uterus. Follow these instructions at home:  Take over-the-counter and prescription medicines only as told by your health care provider.  Do not use any products that contain nicotine or tobacco, such as cigarettes and e-cigarettes. If you need help quitting, ask your health care provider.  If you drink alcohol, limit how much you have to 1 drink a day.  Make dietary changes to lose weight or maintain a healthy weight. Work with your health care provider and a dietitian to set a weight-loss goal that is healthy and reasonable for you.  Seek support from a counselor or support group to talk about your concerns related to infertility. Couples counseling may be helpful for you and your partner.  Practice stress reduction techniques that work well for you, such as regular physical activity, meditation, or deep breathing.  Keep all follow-up visits as told by your health care provider. This is important. Contact a health care provider if you:  Feel that stress is interfering with your life and relationships.  Have side effects from treatments for infertility. Summary  Female infertility refers to a woman's  inability to get pregnant (conceive) after a year of having sex regularly (or after 6 months in women over age 32) without using birth control.  To be diagnosed with infertility, both partners will have a physical exam. Both partners will also have an extensive medical and sexual history taken.  Seek support from a counselor or support group to talk about your concerns related to infertility. Couples counseling may be helpful for you and your partner. This information is not intended to replace advice given to you by your health care provider. Make sure you discuss any questions you have with your health care provider. Document Revised: 12/24/2018 Document Reviewed: 08/04/2017 Elsevier Patient Education  2020 Else[employer] Inc.  

## 2020-08-31 LAB — COMPLETE METABOLIC PANEL WITH GFR
AG Ratio: 1.3 (calc) (ref 1.0–2.5)
ALT: 21 U/L (ref 6–29)
AST: 19 U/L (ref 10–30)
Albumin: 4.2 g/dL (ref 3.6–5.1)
Alkaline phosphatase (APISO): 86 U/L (ref 31–125)
BUN: 10 mg/dL (ref 7–25)
CO2: 28 mmol/L (ref 20–32)
Calcium: 10.4 mg/dL — ABNORMAL HIGH (ref 8.6–10.2)
Chloride: 102 mmol/L (ref 98–110)
Creat: 0.66 mg/dL (ref 0.50–1.10)
GFR, Est African American: 135 mL/min/{1.73_m2} (ref >=60)
GFR, Est Non African American: 117 mL/min/{1.73_m2} (ref >=60)
Globulin: 3.2 g/dL (calc) (ref 1.9–3.7)
Glucose, Bld: 79 mg/dL (ref 65–99)
Potassium: 4.2 mmol/L (ref 3.5–5.3)
Sodium: 139 mmol/L (ref 135–146)
Total Bilirubin: 0.3 mg/dL (ref 0.2–1.2)
Total Protein: 7.4 g/dL (ref 6.1–8.1)

## 2020-08-31 LAB — CBC WITH DIFFERENTIAL/PLATELET
Absolute Monocytes: 665 cells/uL (ref 200–950)
Basophils Absolute: 93 cells/uL (ref 0–200)
Basophils Relative: 0.7 %
Eosinophils Absolute: 439 cells/uL (ref 15–500)
Eosinophils Relative: 3.3 %
HCT: 41.2 % (ref 35.0–45.0)
Hemoglobin: 14.4 g/dL (ref 11.7–15.5)
Lymphs Abs: 3737 cells/uL (ref 850–3900)
MCH: 31.9 pg (ref 27.0–33.0)
MCHC: 35 g/dL (ref 32.0–36.0)
MCV: 91.4 fL (ref 80.0–100.0)
MPV: 10.2 fL (ref 7.5–12.5)
Monocytes Relative: 5 %
Neutro Abs: 8366 cells/uL — ABNORMAL HIGH (ref 1500–7800)
Neutrophils Relative %: 62.9 %
Platelets: 474 10*3/uL — ABNORMAL HIGH (ref 140–400)
RBC: 4.51 10*6/uL (ref 3.80–5.10)
RDW: 12.5 % (ref 11.0–15.0)
Total Lymphocyte: 28.1 %
WBC: 13.3 10*3/uL — ABNORMAL HIGH (ref 3.8–10.8)

## 2020-08-31 LAB — CP TESTOSTERONE, BIO-FEMALE/CHILDREN
Albumin: 4.6 g/dL (ref 3.6–5.1)
Sex Hormone Binding: 32 nmol/L (ref 17–124)
TESTOSTERONE, BIOAVAILABLE: 5.7 ng/dL (ref 0.5–8.5)
Testosterone, Free: 2.7 pg/mL (ref 0.2–5.0)
Testosterone, Total, LC-MS-MS: 23 ng/dL (ref 2–45)

## 2020-08-31 LAB — TSH: TSH: 1.63 mIU/L

## 2020-08-31 LAB — VITAMIN B12: Vitamin B-12: 640 pg/mL (ref 200–1100)

## 2020-08-31 LAB — FSH/LH
FSH: 9.3 m[IU]/mL
LH: 6.7 m[IU]/mL

## 2020-08-31 LAB — DHEA-SULFATE: DHEA-SO4: 388 ug/dL — ABNORMAL HIGH (ref 23–266)

## 2020-09-01 NOTE — Progress Notes (Signed)
Tehya,   Your not in early menopause. Testosterone normal. Thyroid normal. B12 great. Only think off is one hormone DHEA. We see this in polycystic ovarian syndrome but you have no other findings consistent with this. Lets see what GYN says about it.

## 2020-09-10 ENCOUNTER — Encounter: Payer: Self-pay | Admitting: Physician Assistant

## 2020-09-19 ENCOUNTER — Other Ambulatory Visit: Payer: Self-pay | Admitting: Physician Assistant

## 2020-09-19 DIAGNOSIS — F419 Anxiety disorder, unspecified: Secondary | ICD-10-CM

## 2020-09-19 DIAGNOSIS — F32A Depression, unspecified: Secondary | ICD-10-CM

## 2020-09-25 ENCOUNTER — Other Ambulatory Visit (HOSPITAL_COMMUNITY)
Admission: RE | Admit: 2020-09-25 | Discharge: 2020-09-25 | Disposition: A | Discharge status: Home / Self Care | Payer: 59 | Source: Ambulatory Visit | Attending: Physician Assistant | Admitting: Physician Assistant

## 2020-09-25 ENCOUNTER — Encounter: Payer: Self-pay | Admitting: Physician Assistant

## 2020-09-25 ENCOUNTER — Ambulatory Visit (INDEPENDENT_AMBULATORY_CARE_PROVIDER_SITE_OTHER): Payer: 59 | Admitting: Physician Assistant

## 2020-09-25 ENCOUNTER — Other Ambulatory Visit: Payer: Self-pay

## 2020-09-25 VITALS — BP 130/62 | HR 88 | Wt 269.7 lb

## 2020-09-25 DIAGNOSIS — G43111 Migraine with aura, intractable, with status migrainosus: Secondary | ICD-10-CM | POA: Diagnosis not present

## 2020-09-25 DIAGNOSIS — Z124 Encounter for screening for malignant neoplasm of cervix: Secondary | ICD-10-CM

## 2020-09-25 DIAGNOSIS — R519 Headache, unspecified: Secondary | ICD-10-CM

## 2020-09-25 DIAGNOSIS — Z0000 Encounter for general adult medical examination without abnormal findings: Secondary | ICD-10-CM | POA: Diagnosis not present

## 2020-09-25 MED ORDER — RIZATRIPTAN BENZOATE 10 MG PO TABS: ORAL_TABLET | ORAL | 5 refills | Status: DC

## 2020-09-25 NOTE — Progress Notes (Signed)
Subjective:     Shelby Cook is a 35 y.o. female and is here for a comprehensive physical exam. The patient reports problems - hx of abnormal pap with HPV. last pap was normal. nurtec not working for migraine rescue. .   Social History   Socioeconomic History  . Marital status: Married    Spouse name: Not on file  . Number of children: 0  . Years of education: Not on file  . Highest education level: Not on file  Occupational History    Employer: [employer]  Tobacco Use  . Smoking status: Current Some Day Smoker    Packs/day: 0.25    Types: Cigarettes  . Smokeless tobacco: Never Used  Vaping Use  . Vaping Use: Every day  . Substances: Nicotine, Flavoring  Substance and Sexual Activity  . Alcohol use: Yes    Alcohol/week: 0.0 standard drinks  . Drug use: No  . Sexual activity: Yes    Birth control/protection: None  Other Topics Concern  . Not on file  Social History Narrative   Regular exercise yes, joining [employer]   Diet: Avoiding sugar,fruits and veggies   Single 6 month relationship no sex   Social Determinants of Corporate investment banker Strain: Not on file  Food Insecurity: Not on file  Transportation Needs: Not on file  Physical Activity: Not on file  Stress: Not on file  Social Connections: Not on file  Intimate Partner Violence: Not on file   Health Maintenance  Topic Date Due  . PAP SMEAR-Modifier  04/18/2020  . Hepatitis C Screening  03/28/2021 (Originally [birth year])  . TETANUS/TDAP  05/25/2024  . INFLUENZA VACCINE  Completed  . COVID-19 Vaccine  Completed  . HIV Screening  Completed    The following portions of the patient's history were reviewed and updated as appropriate: allergies, current medications, past family history, past medical history, past social history, past surgical history and problem list.  Review of Systems A comprehensive review of systems was negative.   Objective:    BP 130/62   Pulse 88   Wt 269 lb 11.2 oz (122.3 kg)    SpO2 100%   BMI 42.24 kg/m  General appearance: alert, cooperative, appears stated age and morbidly obese Head: Normocephalic, without obvious abnormality, atraumatic Eyes: conjunctivae/corneas clear. PERRL, EOM's intact. Fundi benign. Ears: normal TM's and external ear canals both ears Nose: Nares normal. Septum midline. Mucosa normal. No drainage or sinus tenderness. Throat: lips, mucosa, and tongue normal; teeth and gums normal Neck: no adenopathy, no carotid bruit, no JVD, supple, symmetrical, trachea midline and thyroid not enlarged, symmetric, no tenderness/mass/nodules Back: symmetric, no curvature. ROM normal. No CVA tenderness. Lungs: clear to auscultation bilaterally Breasts: normal appearance, no masses or tenderness Heart: regular rate and rhythm, S1, S2 normal, no murmur, click, rub or gallop Abdomen: soft, non-tender; bowel sounds normal; no masses,  no organomegaly Extremities: extremities normal, atraumatic, no cyanosis or edema Pulses: 2+ and symmetric Skin: Skin color, texture, turgor normal. No rashes or lesions Lymph nodes: Cervical, supraclavicular, and axillary nodes normal. Neurologic: Grossly normal   Pelvic: no adexal tenderness or masses palpated. 3oclock nabothian cyst.  Assessment:    Healthy female exam.      Plan:    Marland KitchenMarland KitchenDiagnoses and all orders for this visit:  Routine physical examination  Worsening headaches -     rizatriptan (MAXALT) 10 MG tablet; TAKE 1 TABLET BY MOUTH AS NEEDED FOR MIGRAINE. MAY REPEAT IN 2 HOURS IF NEEDED  Intractable  migraine with aura with status migrainosus -     rizatriptan (MAXALT) 10 MG tablet; TAKE 1 TABLET BY MOUTH AS NEEDED FOR MIGRAINE. MAY REPEAT IN 2 HOURS IF NEEDED  Routine Papanicolaou smear -     Cytology - PAP  Morbid obesity (HCC)   .Marland Kitchen Discussed 150 minutes of exercise a week.  Encouraged vitamin D 1000 units and Calcium 1300mg  or 4 servings of dairy a day.  Fasting labs ordered previously and  reviewed.  Pap done today.  Covid/flu UTD.   Stop nurtec. Start maxalt.  See After Visit Summary for Counseling Recommendations

## 2020-09-25 NOTE — Patient Instructions (Signed)
Health Maintenance, Female Adopting a healthy lifestyle and getting preventive care are important in promoting health and wellness. Ask your health care provider about:  The right schedule for you to have regular tests and exams.  Things you can do on your own to prevent diseases and keep yourself healthy. What should I know about diet, weight, and exercise? Eat a healthy diet  Eat a diet that includes plenty of vegetables, fruits, low-fat dairy products, and lean protein.  Do not eat a lot of foods that are high in solid fats, added sugars, or sodium.   Maintain a healthy weight Body mass index (BMI) is used to identify weight problems. It estimates body fat based on height and weight. Your health care provider can help determine your BMI and help you achieve or maintain a healthy weight. Get regular exercise Get regular exercise. This is one of the most important things you can do for your health. Most adults should:  Exercise for at least 150 minutes each week. The exercise should increase your heart rate and make you sweat (moderate-intensity exercise).  Do strengthening exercises at least twice a week. This is in addition to the moderate-intensity exercise.  Spend less time sitting. Even light physical activity can be beneficial. Watch cholesterol and blood lipids Have your blood tested for lipids and cholesterol at 33 years of age, then have this test every 5 years. Have your cholesterol levels checked more often if:  Your lipid or cholesterol levels are high.  You are older than 33 years of age.  You are at high risk for heart disease. What should I know about cancer screening? Depending on your health history and family history, you may need to have cancer screening at various ages. This may include screening for:  Breast cancer.  Cervical cancer.  Colorectal cancer.  Skin cancer.  Lung cancer. What should I know about heart disease, diabetes, and high blood  pressure? Blood pressure and heart disease  High blood pressure causes heart disease and increases the risk of stroke. This is more likely to develop in people who have high blood pressure readings, are of African descent, or are overweight.  Have your blood pressure checked: ? Every 3-5 years if you are 18-39 years of age. ? Every year if you are 40 years old or older. Diabetes Have regular diabetes screenings. This checks your fasting blood sugar level. Have the screening done:  Once every three years after age 40 if you are at a normal weight and have a low risk for diabetes.  More often and at a younger age if you are overweight or have a high risk for diabetes. What should I know about preventing infection? Hepatitis B If you have a higher risk for hepatitis B, you should be screened for this virus. Talk with your health care provider to find out if you are at risk for hepatitis B infection. Hepatitis C Testing is recommended for:  Everyone born from 1945 through 1965.  Anyone with known risk factors for hepatitis C. Sexually transmitted infections (STIs)  Get screened for STIs, including gonorrhea and chlamydia, if: ? You are sexually active and are younger than 33 years of age. ? You are older than 33 years of age and your health care provider tells you that you are at risk for this type of infection. ? Your sexual activity has changed since you were last screened, and you are at increased risk for chlamydia or gonorrhea. Ask your health care provider   if you are at risk.  Ask your health care provider about whether you are at high risk for HIV. Your health care provider may recommend a prescription medicine to help prevent HIV infection. If you choose to take medicine to prevent HIV, you should first get tested for HIV. You should then be tested every 3 months for as long as you are taking the medicine. Pregnancy  If you are about to stop having your period (premenopausal) and  you may become pregnant, seek counseling before you get pregnant.  Take 400 to 800 micrograms (mcg) of folic acid every day if you become pregnant.  Ask for birth control (contraception) if you want to prevent pregnancy. Osteoporosis and menopause Osteoporosis is a disease in which the bones lose minerals and strength with aging. This can result in bone fractures. If you are 65 years old or older, or if you are at risk for osteoporosis and fractures, ask your health care provider if you should:  Be screened for bone loss.  Take a calcium or vitamin D supplement to lower your risk of fractures.  Be given hormone replacement therapy (HRT) to treat symptoms of menopause. Follow these instructions at home: Lifestyle  Do not use any products that contain nicotine or tobacco, such as cigarettes, e-cigarettes, and chewing tobacco. If you need help quitting, ask your health care provider.  Do not use street drugs.  Do not share needles.  Ask your health care provider for help if you need support or information about quitting drugs. Alcohol use  Do not drink alcohol if: ? Your health care provider tells you not to drink. ? You are pregnant, may be pregnant, or are planning to become pregnant.  If you drink alcohol: ? Limit how much you use to 0-1 drink a day. ? Limit intake if you are breastfeeding.  Be aware of how much alcohol is in your drink. In the [place] one drink equals one 12 oz bottle of beer (355 mL), one 5 oz glass of wine (148 mL), or one 1 oz glass of hard liquor (44 mL). General instructions  Schedule regular health, dental, and eye exams.  Stay current with your vaccines.  Tell your health care provider if: ? You often feel depressed. ? You have ever been abused or do not feel safe at home. Summary  Adopting a healthy lifestyle and getting preventive care are important in promoting health and wellness.  Follow your health care provider's instructions about healthy  diet, exercising, and getting tested or screened for diseases.  Follow your health care provider's instructions on monitoring your cholesterol and blood pressure. This information is not intended to replace advice given to you by your health care provider. Make sure you discuss any questions you have with your health care provider. Document Revised: 08/26/2018 Document Reviewed: 08/26/2018 Elsevier Patient Education  2021 Elsevier Inc.  

## 2020-10-02 ENCOUNTER — Encounter: Payer: Self-pay | Admitting: Physician Assistant

## 2020-10-03 ENCOUNTER — Encounter: Payer: Self-pay | Admitting: Physician Assistant

## 2020-10-03 DIAGNOSIS — R87618 Other abnormal cytological findings on specimens from cervix uteri: Secondary | ICD-10-CM | POA: Insufficient documentation

## 2020-10-03 LAB — CYTOLOGY - PAP
Comment: NEGATIVE
Comment: NEGATIVE
Diagnosis: NEGATIVE
HPV 16: NEGATIVE
HPV 18 / 45: NEGATIVE
High risk HPV: POSITIVE — AB

## 2020-10-03 NOTE — Progress Notes (Signed)
HPV is positive but no abnormal cells. And HPV is not the high risk version. You do need pap every year to keep a close follow up.

## 2020-11-12 ENCOUNTER — Encounter: Payer: Self-pay | Admitting: Physician Assistant

## 2020-12-04 ENCOUNTER — Other Ambulatory Visit: Payer: Self-pay

## 2020-12-04 ENCOUNTER — Emergency Department (INDEPENDENT_AMBULATORY_CARE_PROVIDER_SITE_OTHER): Payer: 59

## 2020-12-04 ENCOUNTER — Encounter: Payer: Self-pay | Admitting: Emergency Medicine

## 2020-12-04 ENCOUNTER — Emergency Department
Admission: EM | Admit: 2020-12-04 | Discharge: 2020-12-04 | Disposition: A | Discharge status: Home / Self Care | Payer: 59 | Source: Home / Self Care | Attending: Family Medicine | Admitting: Family Medicine

## 2020-12-04 DIAGNOSIS — K7581 Nonalcoholic steatohepatitis (NASH): Secondary | ICD-10-CM | POA: Insufficient documentation

## 2020-12-04 DIAGNOSIS — R1011 Right upper quadrant pain: Secondary | ICD-10-CM

## 2020-12-04 MED ORDER — ONDANSETRON HCL 8 MG PO TABS: 8.0000 mg | ORAL_TABLET | Freq: Three times a day (TID) | ORAL | 0 refills | Status: DC | PRN

## 2020-12-04 NOTE — Discharge Instructions (Addendum)
Bland diet, eat frequent small meals Drink plenty of fluids Take Zofran as needed for nausea See your doctor if this pain persists

## 2020-12-04 NOTE — ED Provider Notes (Signed)
[facility]    CSN: [id number] Arrival date & time: 12/04/20  1006      History   Chief Complaint Chief Complaint  Patient presents with  . Abdominal Pain    Right    HPI Shelby Cook is a 35 y.o. female.   HPI   Patient is here for right upper quadrant pain.  Is been present since yesterday.  She had some diarrhea and took Imodium.  This is stopped.  She has had some nausea but no vomiting.  She has not been eating as much.  She is trying to drink enough..  No fever chills.  No headache or body ache.  She is vaccinated against COVID.  Has not had no abdominal surgeries.  No urinary symptoms.  Past Medical History:  Diagnosis Date  . Allergy   . Vaginal Pap smear, abnormal     Patient Active Problem List   Diagnosis Date Noted  . NASH (nonalcoholic steatohepatitis) 12/04/2020  . Pap smear abnormality of cervix/human papillomavirus (HPV) positive 10/03/2020  . Hives 03/28/2020  . Depression, recurrent (HCC) 03/28/2020  . Worsening headaches 03/28/2020  . Intractable migraine with aura with status migrainosus 03/28/2020  . Trouble in sleeping 03/28/2020  . History of abnormal cervical Pap smear 04/19/2019  . Encounter for smoking cessation counseling 04/03/2018  . Need for rhogam due to Rh negative mother 11/19/2017  . Morbid obesity (HCC) 10/23/2015  . Environmental allergies 08/18/2015  . Seasonal allergies 08/04/2015  . Anxiety 08/04/2015  . Multiple allergies 08/04/2015  . 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 Allergic rhinitis 02/16/2007  . OLIGOMENORRHEA 02/16/2007  . ACNE NEC 02/16/2007    Past Surgical History:  Procedure Laterality Date  . WISDOM TOOTH EXTRACTION      OB History    Gravida  1   Para      Term      Preterm      AB  1   Living        SAB  1   IAB      Ectopic      Multiple      Live Births               Home Medications    Prior to Admission medications    Medication Sig Start Date End Date Taking? Authorizing Provider  cetirizine (ZYRTEC) 5 MG tablet Take 5 mg by mouth daily. Take 2 in am  & 1 in pm   Yes [provider]  diphenhydrAMINE (BENADRYL) 25 MG tablet Take 25 mg by mouth at bedtime.   Yes [provider]  montelukast (SINGULAIR) 10 MG tablet TAKE 1 TABLET BY MOUTH EVERYDAY AT BEDTIME 04/18/20  Yes Breeback, Jade L, PA-C  ondansetron (ZOFRAN) 8 MG tablet Take 1 tablet (8 mg total) by mouth every 8 (eight) hours as needed for nausea or vomiting. 12/04/20  Yes Eustace Moore, MD  rizatriptan (MAXALT) 10 MG tablet TAKE 1 TABLET BY MOUTH AS NEEDED FOR MIGRAINE. MAY REPEAT IN 2 HOURS IF NEEDED 09/25/20  Yes Breeback, Jade L, PA-C  vortioxetine HBr (TRINTELLIX) 10 MG TABS tablet Take one and one-half tablet daily. 08/28/20  Yes Breeback, Jade L, PA-C  loratadine (CLARITIN) 10 MG tablet Take 10 mg by mouth daily. Patient not taking: Reported on 12/04/2020    [provider]    Family History Family History  Problem Relation Age of Onset  . Drug abuse Father   .  Mental illness Father        bipolar  . Diabetes Father   . Hypertension Father   . Heart attack Father   . Osteoporosis Mother   . Scoliosis Sister   . Cancer Paternal Grandmother        breast and lung  . Diabetes Paternal Grandfather   . Heart attack Paternal Grandfather   . Allergies Sister     Social History Social History   Tobacco Use  . Smoking status: Former Smoker    Packs/day: 0.25  . Smokeless tobacco: Never Used  Vaping Use  . Vaping Use: Some days  . Substances: Nicotine, Flavoring  Substance Use Topics  . Alcohol use: Yes    Alcohol/week: 0.0 standard drinks  . Drug use: No     Allergies   Celexa [citalopram hydrobromide] and Flonase [fluticasone propionate]   Review of Systems Review of Systems See HPI  Physical Exam Triage Vital Signs ED Triage Vitals  Enc Vitals Group     BP 12/04/20 1020 119/83     Pulse  Rate 12/04/20 1020 87     Resp 12/04/20 1020 17     Temp 12/04/20 1020 98.6 F (37 C)     Temp Source 12/04/20 1020 Oral     SpO2 12/04/20 1020 98 %     Weight 12/04/20 1023 265 lb (120.2 kg)     Height 12/04/20 1023 5\' 7"  (1.702 m)     Head Circumference --      Peak Flow --      Pain Score 12/04/20 1022 3     Pain Loc --      Pain Edu? --      Excl. in GC? --    No data found.  Updated Vital Signs BP 119/83   Pulse 87   Temp 98.6 F (37 C) (Oral)   Resp 17   Ht 5\' 7"  (1.702 m)   Wt 120.2 kg   LMP 12/02/2020 (Exact Date)   SpO2 98%   BMI 41.50 kg/m      Physical Exam Constitutional:      General: She is not in acute distress.    Appearance: She is well-developed. She is obese.  HENT:     Head: Normocephalic and atraumatic.  Eyes:     Conjunctiva/sclera: Conjunctivae normal.     Pupils: Pupils are equal, round, and reactive to light.  Cardiovascular:     Rate and Rhythm: Normal rate.  Pulmonary:     Effort: Pulmonary effort is normal. No respiratory distress.  Abdominal:     General: Bowel sounds are normal. There is no distension.     Palpations: Abdomen is soft. There is hepatomegaly. There is no splenomegaly.     Tenderness: There is abdominal tenderness in the right upper quadrant.     Comments: Liver edge is palpable under ribs, tender  Musculoskeletal:        General: Normal range of motion.     Cervical back: Normal range of motion.  Skin:    General: Skin is warm and dry.  Neurological:     Mental Status: She is alert.      UC Treatments / Results  Labs (all labs ordered are listed, but only abnormal results are displayed) Labs Reviewed  COMPLETE METABOLIC PANEL WITH GFR  CBC WITH DIFFERENTIAL/PLATELET  LIPASE    EKG   Radiology Abdomen Limited RUQ (LIVER/GB)  Result Date: 12/04/2020 CLINICAL DATA:  Right upper quadrant pain EXAM:  ULTRASOUND ABDOMEN LIMITED RIGHT UPPER QUADRANT COMPARISON:  None. FINDINGS: Gallbladder: No  gallstones or wall thickening visualized. There is no pericholecystic fluid. No sonographic Murphy sign noted by sonographer. Common bile duct: Diameter: 3 mm. No intrahepatic or extrahepatic biliary duct dilatation. Liver: Liver echogenicity is increased diffusely. There is a focal area of decreased echogenicity near the gallbladder fossa measuring 2.1 x 1.0 x 1.2 cm, likely representing focal fatty sparing. Beyond area of suspected fatty sparing, no focal liver lesions appreciable. Portal vein is patent on color Doppler imaging with normal direction of blood flow towards the liver. Other: None. IMPRESSION: Increased liver echogenicity, likely due to hepatic steatosis. Suspect fatty sparing near the gallbladder fossa region. Beyond area of apparent fatty sparing, no focal liver lesions evident on this study. Note that the sensitivity of ultrasound for detection of focal liver lesions is somewhat diminished in this circumstance. Study otherwise unremarkable. Electronically Signed   By: Bretta Bang III M.D.   On: 12/04/2020 12:11    Procedures Procedures (including critical care time)  Medications Ordered in UC Medications - No data to display  Initial Impression / Assessment and Plan / UC Course  I have reviewed the triage vital signs and the nursing notes.  Pertinent labs & imaging results that were available during my care of the patient were reviewed by me and considered in my medical decision making (see chart for details).     Reviewed the patient does not appear to have a gallstone.  She has NASH.  Currently we will treat her nausea, increase fluids, have her stay off work today.  May just have a viral gastritis causing her abdominal pain.N ASH does not usually cause pain. Final Clinical Impressions(s) / UC Diagnoses   Final diagnoses:  Right upper quadrant abdominal pain     Discharge Instructions     Bland diet, eat frequent small meals Drink plenty of fluids Take Zofran as  needed for nausea See your doctor if this pain persists    ED Prescriptions    Medication Sig Dispense Auth. Provider   ondansetron (ZOFRAN) 8 MG tablet Take 1 tablet (8 mg total) by mouth every 8 (eight) hours as needed for nausea or vomiting. 20 tablet Eustace Moore, MD     PDMP not reviewed this encounter.   Eustace Moore, MD 12/04/20 [phone]

## 2020-12-04 NOTE — ED Triage Notes (Signed)
RUQ pain since yesterday  Diarrhea yesterday - none today - took Imodium last night Denies fever or chills  No nausea today  Denies emesis  COVID 10/2020

## 2020-12-05 LAB — CBC WITH DIFFERENTIAL/PLATELET
Absolute Monocytes: 538 cells/uL (ref 200–950)
Basophils Absolute: 47 cells/uL (ref 0–200)
Basophils Relative: 0.4 %
Eosinophils Absolute: 562 cells/uL — ABNORMAL HIGH (ref 15–500)
Eosinophils Relative: 4.8 %
HCT: 42.4 % (ref 35.0–45.0)
Hemoglobin: 14.7 g/dL (ref 11.7–15.5)
Lymphs Abs: 3218 cells/uL (ref 850–3900)
MCH: 33.2 pg — ABNORMAL HIGH (ref 27.0–33.0)
MCHC: 34.7 g/dL (ref 32.0–36.0)
MCV: 95.7 fL (ref 80.0–100.0)
MPV: 10.1 fL (ref 7.5–12.5)
Monocytes Relative: 4.6 %
Neutro Abs: 7336 cells/uL (ref 1500–7800)
Neutrophils Relative %: 62.7 %
Platelets: 421 10*3/uL — ABNORMAL HIGH (ref 140–400)
RBC: 4.43 10*6/uL (ref 3.80–5.10)
RDW: 12.8 % (ref 11.0–15.0)
Total Lymphocyte: 27.5 %
WBC: 11.7 10*3/uL — ABNORMAL HIGH (ref 3.8–10.8)

## 2020-12-05 LAB — COMPLETE METABOLIC PANEL WITH GFR
AG Ratio: 1.3 (calc) (ref 1.0–2.5)
ALT: 21 U/L (ref 6–29)
AST: 19 U/L (ref 10–30)
Albumin: 3.9 g/dL (ref 3.6–5.1)
Alkaline phosphatase (APISO): 82 U/L (ref 31–125)
BUN: 8 mg/dL (ref 7–25)
CO2: 19 mmol/L — ABNORMAL LOW (ref 20–32)
Calcium: 9 mg/dL (ref 8.6–10.2)
Chloride: 105 mmol/L (ref 98–110)
Creat: 0.78 mg/dL (ref 0.50–1.10)
GFR, Est African American: 117 mL/min/{1.73_m2} (ref >=60)
GFR, Est Non African American: 101 mL/min/{1.73_m2} (ref >=60)
Globulin: 2.9 g/dL (calc) (ref 1.9–3.7)
Glucose, Bld: 107 mg/dL — ABNORMAL HIGH (ref 65–99)
Potassium: 4.1 mmol/L (ref 3.5–5.3)
Sodium: 139 mmol/L (ref 135–146)
Total Bilirubin: 0.3 mg/dL (ref 0.2–1.2)
Total Protein: 6.8 g/dL (ref 6.1–8.1)

## 2020-12-05 LAB — LIPASE: Lipase: 23 U/L (ref 7–60)

## 2021-01-07 ENCOUNTER — Other Ambulatory Visit: Payer: Self-pay | Admitting: Physician Assistant

## 2021-01-07 DIAGNOSIS — Z889 Allergy status to unspecified drugs, medicaments and biological substances status: Secondary | ICD-10-CM

## 2021-01-07 DIAGNOSIS — Z9109 Other allergy status, other than to drugs and biological substances: Secondary | ICD-10-CM

## 2021-02-16 ENCOUNTER — Encounter: Payer: Self-pay | Admitting: Physician Assistant

## 2021-02-19 ENCOUNTER — Other Ambulatory Visit: Payer: Self-pay

## 2021-02-19 ENCOUNTER — Encounter: Payer: Self-pay | Admitting: Physician Assistant

## 2021-02-19 ENCOUNTER — Ambulatory Visit: Payer: 59 | Admitting: Physician Assistant

## 2021-02-19 VITALS — BP 124/64 | HR 79 | Ht 67.0 in | Wt 274.0 lb

## 2021-02-19 DIAGNOSIS — Z6841 Body Mass Index (BMI) 40.0 and over, adult: Secondary | ICD-10-CM | POA: Diagnosis not present

## 2021-02-19 MED ORDER — PHENTERMINE HCL 37.5 MG PO TABS: 37.5000 mg | ORAL_TABLET | Freq: Every day | ORAL | 0 refills | Status: DC

## 2021-02-19 NOTE — Patient Instructions (Signed)
plenity

## 2021-02-19 NOTE — Progress Notes (Signed)
   Subjective:    Patient ID: Shelby Cook, female    DOB: [birth year], 35 y.o.   MRN: [id number]  HPI  Pt is a 35 yo obese female who presents to the clinic to discuss weight. She has tried phentermine before and worked well. Her husband is on it too right now. She is wanting to work together to lose weight. Her insurance has not paid well in the past for other weight loss medication. She is trying to be more active. Mood is great.   .. Active Ambulatory Problems    Diagnosis Date Noted  . Allergic rhinitis 02/16/2007  . OLIGOMENORRHEA 02/16/2007  . 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 Seasonal allergies 08/04/2015  . Anxiety 08/04/2015  . Multiple allergies 08/04/2015  . Environmental allergies 08/18/2015  . Class 3 severe obesity due to excess calories without serious comorbidity with body mass index (BMI) of 40.0 to 44.9 in adult (HCC) 10/23/2015  . Need for rhogam due to Rh negative mother 11/19/2017  . Encounter for smoking cessation counseling 04/03/2018  . History of abnormal cervical Pap smear 04/19/2019  . Hives 03/28/2020  . Depression, recurrent (HCC) 03/28/2020  . Worsening headaches 03/28/2020  . Intractable migraine with aura with status migrainosus 03/28/2020  . Trouble in sleeping 03/28/2020  . Pap smear abnormality of cervix/human papillomavirus (HPV) positive 10/03/2020  . NASH (nonalcoholic steatohepatitis) 12/04/2020   Resolved Ambulatory Problems    Diagnosis Date Noted  . OBESITY NOS 02/16/2007  . Headache(784.0) 07/25/2010  . Right knee pain 10/23/2015  . Abnormal weight gain 10/23/2015   Past Medical History:  Diagnosis Date  . Allergy   . Vaginal Pap smear, abnormal      Review of Systems See HPI.     Objective:   Physical Exam Vitals reviewed.  Constitutional:      Appearance: Normal appearance. She is obese.  Cardiovascular:     Rate and Rhythm: Normal rate and regular rhythm.      Pulses: Normal pulses.     Heart sounds: Normal heart sounds.  Neurological:     General: No focal deficit present.     Mental Status: She is alert and oriented to person, place, and time.  Psychiatric:        Mood and Affect: Mood normal.           Assessment & Plan:  Marland KitchenMarland KitchenPearlene was seen today for weight gain.  Diagnoses and all orders for this visit:  Class 3 severe obesity due to excess calories without serious comorbidity with body mass index (BMI) of 40.0 to 44.9 in adult (HCC) -     phentermine (ADIPEX-P) 37.5 MG tablet; Take 1 tablet (37.5 mg total) by mouth daily before breakfast.  Other orders -     Discontinue: phentermine (ADIPEX-P) 37.5 MG tablet; Take 1 tablet (37.5 mg total) by mouth daily before breakfast.  ..Discussed low carb diet with 1500 calories and 80g of protein.  Exercising at least 150 minutes a week.  [employer] could be a Chief Technology Officer.  Discussed plenity that could be more affordable then the other medications. 100 dollars a month was still not affordable.   Discussed phentermine and side effects.  Take 1/2 tablet daily.  Follow up in 6 months.

## 2021-03-06 ENCOUNTER — Other Ambulatory Visit: Payer: Self-pay | Admitting: Physician Assistant

## 2021-03-06 DIAGNOSIS — G43111 Migraine with aura, intractable, with status migrainosus: Secondary | ICD-10-CM

## 2021-03-06 DIAGNOSIS — R519 Headache, unspecified: Secondary | ICD-10-CM

## 2021-04-27 ENCOUNTER — Ambulatory Visit: Payer: 59

## 2021-04-28 IMAGING — MR MR HEAD W/O CM
10 series · 48 of 48 positions shown · non-contrast
Comparison: None.

CLINICAL DATA: Worsening of headaches with visual disturbance and
disorientation. Headaches are worse on the left. Symptoms over the
last 2 months.

EXAM:
MRI HEAD WITHOUT CONTRAST
TECHNIQUE: Multiplanar, multiecho pulse sequences of the brain and surrounding
structures were obtained without intravenous contrast.

[Series 3: DWI · axial · 3.0mm · 1.20mm/px · z∈[-43,+119]mm · 6 of 55 slices shown (1 of 2)]
[im 1/55]
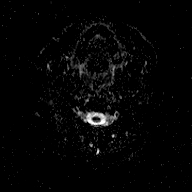
[im 11/55]
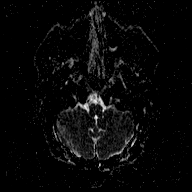
[im 22/55]
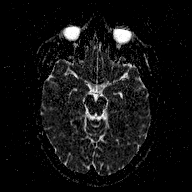
[im 33/55]
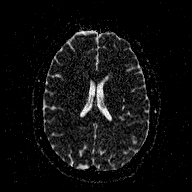
[im 44/55]
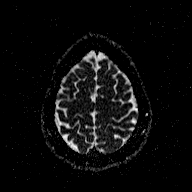
[im 55/55]
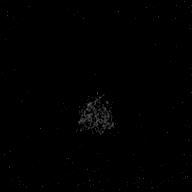

[Series 5: DWI · coronal · 3.0mm · 1.15mm/px · 4 of 47 slices shown (2 of 2)]
[im 1/47]
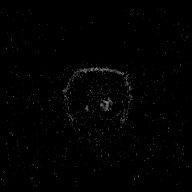
[im 16/47]
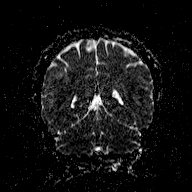
[im 31/47]
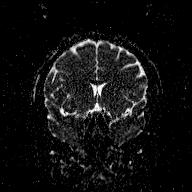
[im 47/47]
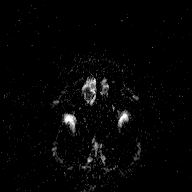

[Series 6: T1 · sagittal · 5.0mm · 0.45mm/px · 2 of 23 slices shown (1 of 2)]
[im 1/23]
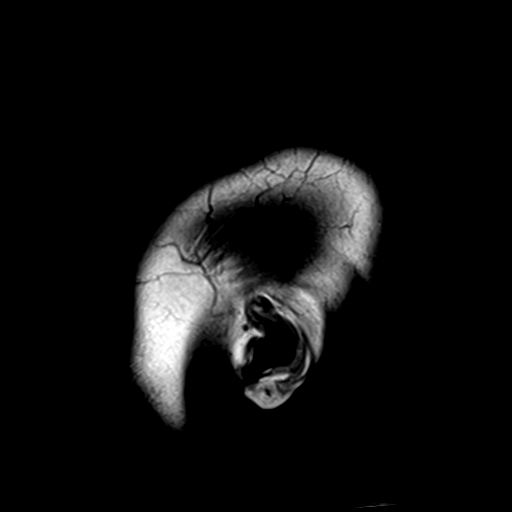
[im 23/23]
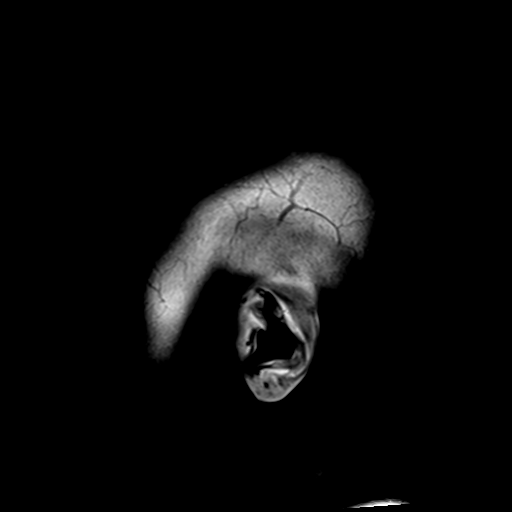

[Series 7: T2 · axial · 5.0mm · 0.72mm/px · z∈[-36,+118]mm · 2 of 23 slices shown (1 of 3)]
[im 1/23]
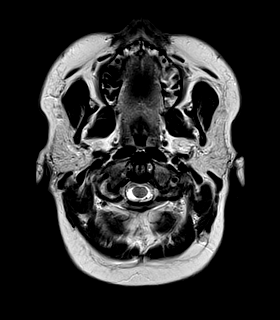
[im 23/23]
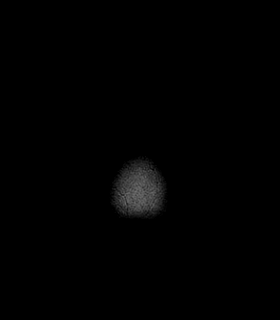

[Series 8: FLAIR · axial · 3.0mm · 0.45mm/px · z∈[-40,+122]mm · 5 of 55 slices shown]
[im 1/55]
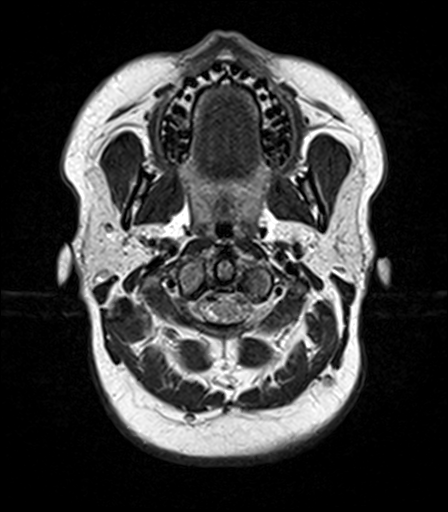
[im 14/55]
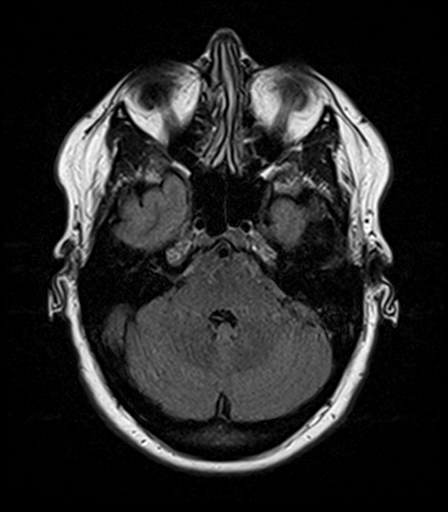
[im 28/55]
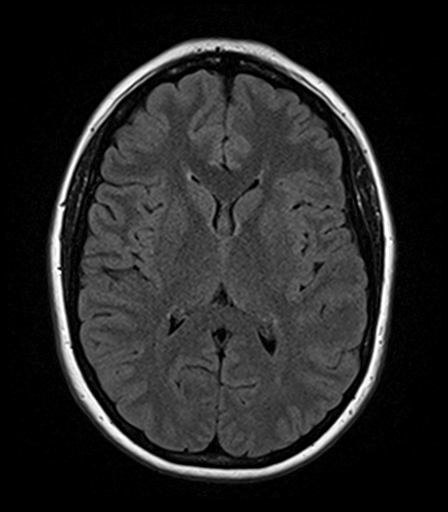
[im 41/55]
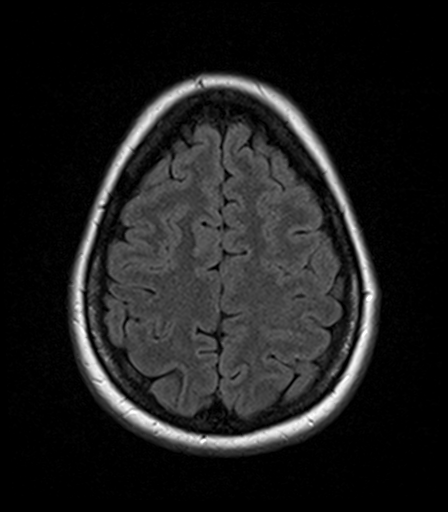
[im 55/55]
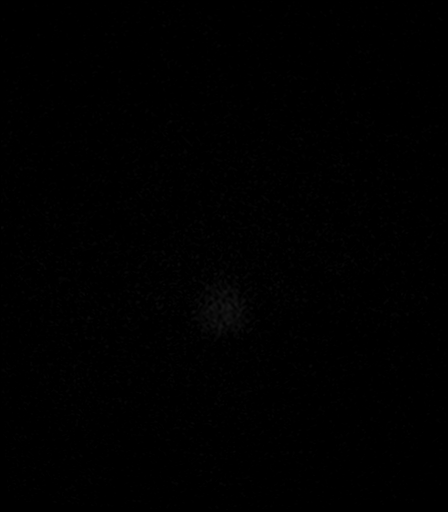

[Series 9: T2 · axial · 5.0mm · 0.72mm/px · z∈[-36,+118]mm · 2 of 23 slices shown (2 of 3)]
[im 1/23]
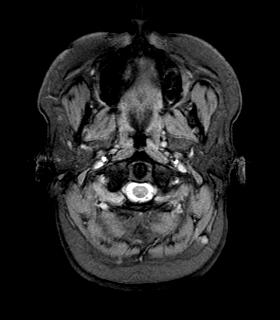
[im 23/23]
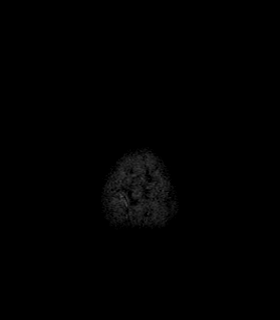

[Series 10: T1 · axial · 1.0mm · 1.00mm/px · z∈[-38,+121]mm · 15 of 160 slices shown (2 of 2)]
[im 1/160]
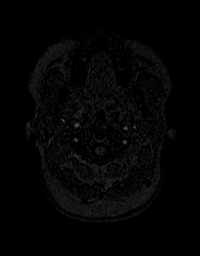
[im 12/160]
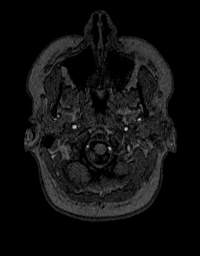
[im 23/160]
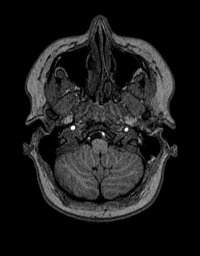
[im 35/160]
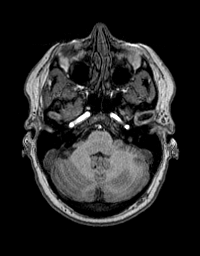
[im 46/160]
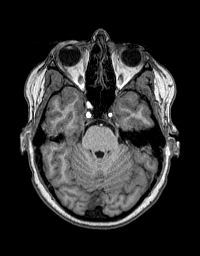
[im 57/160]
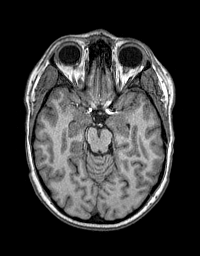
[im 69/160]
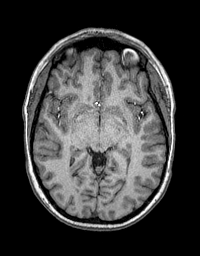
[im 80/160]
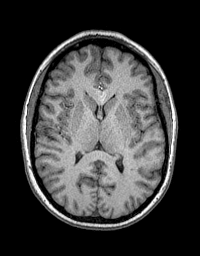
[im 91/160]
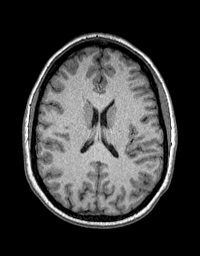
[im 103/160]
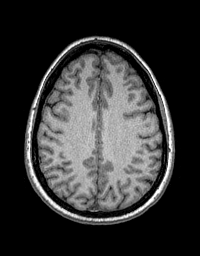
[im 114/160]
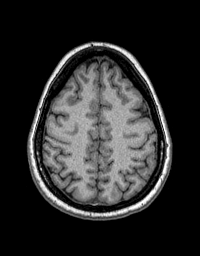
[im 125/160]
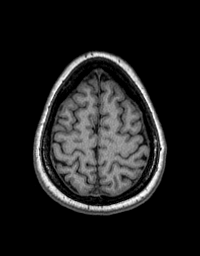
[im 137/160]
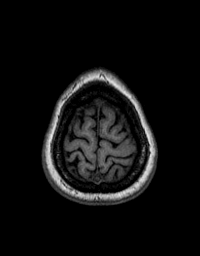
[im 148/160]
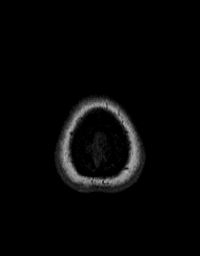
[im 160/160]
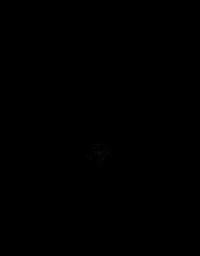

[Series 11: T2 · coronal · 5.0mm · 0.43mm/px · 3 of 29 slices shown (3 of 3)]
[im 1/29]
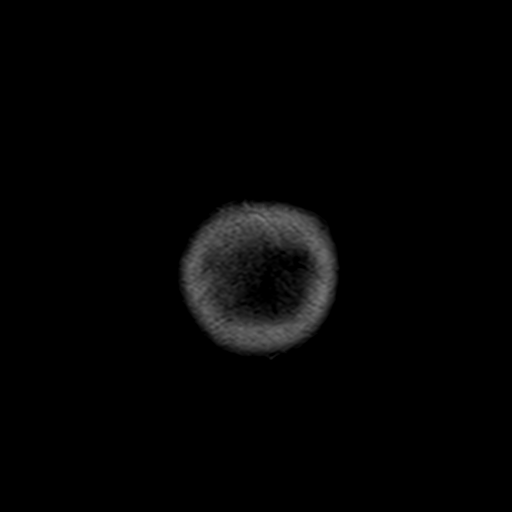
[im 15/29]
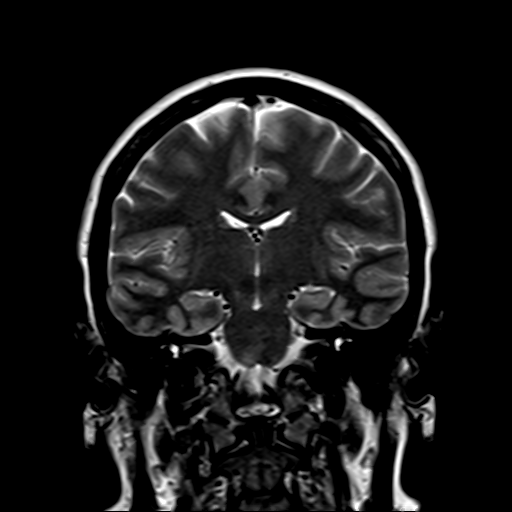
[im 29/29]
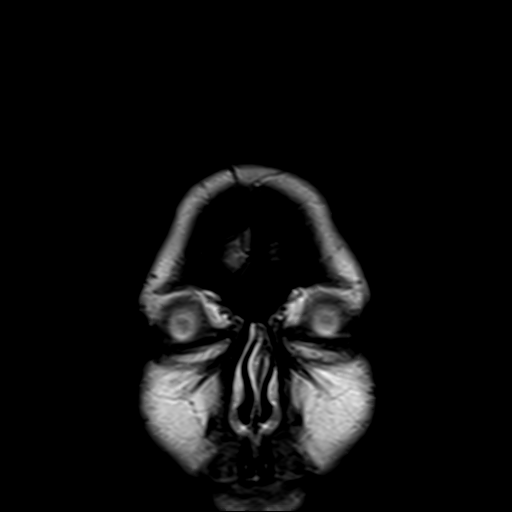

[Series 100: (id) ax · axial · 3.0mm · 1.20mm/px · z∈[-43,+119]mm · 5 of 55 slices shown]
[im 1/55]
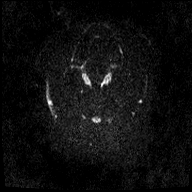
[im 14/55]
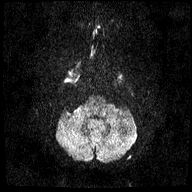
[im 28/55]
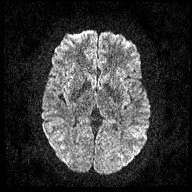
[im 41/55]
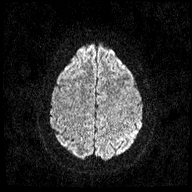
[im 55/55]
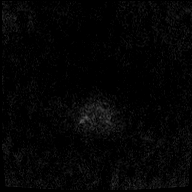

[Series 101: (id) cor · coronal · 3.0mm · 1.15mm/px · 4 of 47 slices shown]
[im 1/47]
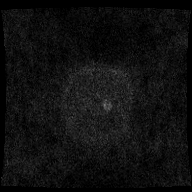
[im 16/47]
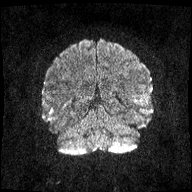
[im 31/47]
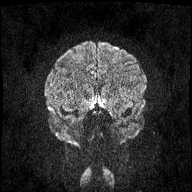
[im 47/47]
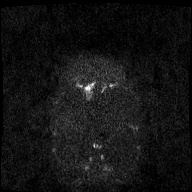

[48 of 48 positions shown; findings below may reference images not displayed]

FINDINGS: Brain: The brain has a normal appearance without evidence of
malformation, atrophy, old or acute small or large vessel
infarction, mass lesion, hemorrhage, hydrocephalus or extra-axial
collection.

Vascular: Major vessels at the base of the brain show flow. Venous
sinuses appear patent.

Skull and upper cervical spine: Normal.

Sinuses/Orbits: Clear/normal.

Other: None significant.
IMPRESSION: Normal examination. No abnormality seen to explain the clinical
presentation.

## 2021-04-30 ENCOUNTER — Telehealth: Payer: Self-pay | Admitting: General Practice

## 2021-04-30 NOTE — Telephone Encounter (Signed)
Transition Care Management Unsuccessful Follow-up Telephone Call  Date of discharge and from where:  04/27/21 from [facility]  Attempts:  1st Attempt  Reason for unsuccessful TCM follow-up call:  No answer/busy    

## 2021-05-02 NOTE — Telephone Encounter (Signed)
Transition Care Management Follow-up Telephone Call Date of discharge and from where: 04/27/21 from [facility] How have you been since you were released from the hospital? Doing much better. Any questions or concerns? No  Items Reviewed: Did the pt receive and understand the discharge instructions provided? Yes  Medications obtained and verified? No  Other? No  Any new allergies since your discharge? No  Dietary orders reviewed? Yes Do you have support at home? Yes   Home Care and Equipment/Supplies: Were home health services ordered? no  Functional Questionnaire: (I = Independent and D = Dependent) ADLs: I  Bathing/Dressing- I  Meal Prep- I  Eating- I  Maintaining continence- I  Transferring/Ambulation- I  Managing Meds- I  Follow up appointments reviewed:  PCP Hospital f/u appt confirmed?  Patient will call to schedule hospital follow up.   Specialist Hospital f/u appt confirmed? No   Are transportation arrangements needed? No  If their condition worsens, is the pt aware to call PCP or go to the Emergency Dept.? Yes Was the patient provided with contact information for the PCP's office or ED? Yes Was to pt encouraged to call back with questions or concerns? Yes

## 2021-05-07 ENCOUNTER — Other Ambulatory Visit: Payer: Self-pay | Admitting: Physician Assistant

## 2021-05-07 DIAGNOSIS — F419 Anxiety disorder, unspecified: Secondary | ICD-10-CM

## 2021-05-07 DIAGNOSIS — F32A Depression, unspecified: Secondary | ICD-10-CM

## 2021-07-19 ENCOUNTER — Other Ambulatory Visit: Payer: Self-pay

## 2021-07-19 ENCOUNTER — Emergency Department
Admission: RE | Admit: 2021-07-19 | Discharge: 2021-07-19 | Disposition: A | Discharge status: Home / Self Care | Payer: 59 | Source: Ambulatory Visit | Attending: Family Medicine | Admitting: Family Medicine

## 2021-07-19 VITALS — BP 119/81 | HR 94 | Temp 98.6°F | Resp 14

## 2021-07-19 DIAGNOSIS — B349 Viral infection, unspecified: Secondary | ICD-10-CM

## 2021-07-19 LAB — POC INFLUENZA A AND B ANTIGEN (URGENT CARE ONLY)
Influenza A Ag: NEGATIVE
Influenza B Ag: NEGATIVE

## 2021-07-19 NOTE — Discharge Instructions (Addendum)
Influenza is negative Drink lots of fluids Take over-the-counter Tylenol, Advil, or Aleve for any aches and fatigue Call if worse instead of better  Off work until Monday

## 2021-07-19 NOTE — ED Triage Notes (Signed)
Pt presents with body aches, chills, and sore throat that began yesterday. Positive flu exposure.

## 2021-07-20 NOTE — ED Provider Notes (Signed)
Shelby Cook CARE    CSN: [id number] Arrival date & time: 07/19/21  1849      History   Chief Complaint Chief Complaint  Patient presents with   appt 7PM   Generalized Body Aches   Chills   Sore Throat    HPI Shelby Cook is a 35 y.o. female.   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  Started with body aches chills and sore throat yesterday and wants flu test  Past Medical History:  Diagnosis Date   Allergy    Vaginal Pap smear, abnormal     Patient Active Problem List   Diagnosis Date Noted   NASH (nonalcoholic steatohepatitis) 12/04/2020   Pap smear abnormality of cervix/human papillomavirus (HPV) positive 10/03/2020   Hives 03/28/2020   Depression, recurrent (HCC) 03/28/2020   Worsening headaches 03/28/2020   Intractable migraine with aura with status migrainosus 03/28/2020   Trouble in sleeping 03/28/2020   History of abnormal cervical Pap smear 04/19/2019   Encounter for smoking cessation counseling 04/03/2018   Need for rhogam due to Rh negative mother 11/19/2017   Class 3 severe obesity due to excess calories without serious comorbidity with body mass index (BMI) of 40.0 to 44.9 in adult [facility]) 10/23/2015   Environmental allergies 08/18/2015   Seasonal allergies 08/04/2015   Anxiety 08/04/2015   Multiple allergies 08/04/2015   LGSIL (low grade squamous intraepithelial dysplasia) 07/27/2008   Allergic rhinitis 02/16/2007   OLIGOMENORRHEA 02/16/2007   ACNE NEC 02/16/2007    Past Surgical History:  Procedure Laterality Date   WISDOM TOOTH EXTRACTION      OB History     Gravida  1   Para      Term      Preterm      AB  1   Living         SAB  1   IAB      Ectopic      Multiple      Live Births               Home Medications    Prior to Admission medications   Medication Sig Start Date End Date Taking? Authorizing Provider   cetirizine (ZYRTEC) 5 MG tablet Take 5 mg by mouth daily. Take 2 in am  & 1 in pm    [provider]  diphenhydrAMINE (BENADRYL) 25 MG tablet Take 25 mg by mouth at bedtime.    [provider]  loratadine (CLARITIN) 10 MG tablet Take 10 mg by mouth daily.    [provider]  montelukast (SINGULAIR) 10 MG tablet TAKE 1 TABLET BY MOUTH EVERYDAY AT BEDTIME 01/08/21   Breeback, Jade L, PA-C  rizatriptan (MAXALT) 10 MG tablet TAKE 1 TABLET BY MOUTH AS NEEDED FOR MIGRAINE. MAY REPEAT IN 2 HOURS IF NEEDED 03/06/21   Breeback, Jade L, PA-C  TRINTELLIX 10 MG TABS tablet TAKE 1 AND 1/2 TABLETS EVERY DAY 05/07/21   Jomarie Longs, PA-C    Family History Family History  Problem Relation Age of Onset   Drug abuse Father    Mental illness Father        bipolar   Diabetes Father    Hypertension Father    Heart attack Father    Osteoporosis Mother    Scoliosis Sister    Cancer Paternal Grandmother  breast and lung   Diabetes Paternal Grandfather    Heart attack Paternal Grandfather    Allergies Sister     Social History Social History   Tobacco Use   Smoking status: Former    Packs/day: 0.25    Types: Cigarettes   Smokeless tobacco: Never  Vaping Use   Vaping Use: Some days   Substances: Nicotine, Flavoring  Substance Use Topics   Alcohol use: Yes    Alcohol/week: 0.0 standard drinks   Drug use: No     Allergies   Celexa [citalopram hydrobromide] and Flonase [fluticasone propionate]   Review of Systems Review of Systems See HPI  Physical Exam Triage Vital Signs ED Triage Vitals  Enc Vitals Group     BP 07/19/21 1916 119/81     Pulse Rate 07/19/21 1916 94     Resp 07/19/21 1916 14     Temp 07/19/21 1916 98.6 F (37 C)     Temp Source 07/19/21 1916 Oral     SpO2 07/19/21 1916 97 %     Weight --      Height --      Head Circumference --      Peak Flow --      Pain Score 07/19/21 1917 1     Pain Loc --      Pain Edu? --      Excl. in  [place]? --    No data found.  Updated Vital Signs BP 119/81 (BP Location: Left Arm)   Pulse 94   Temp 98.6 F (37 C) (Oral)   Resp 14   SpO2 97%      Physical Exam Constitutional:      General: She is not in acute distress.    Appearance: She is well-developed. She is obese. She is ill-appearing.  HENT:     Head: Normocephalic and atraumatic.     Right Ear: Tympanic membrane and ear canal normal.     Left Ear: Tympanic membrane and ear canal normal.     Nose: Congestion present.     Mouth/Throat:     Mouth: Mucous membranes are moist.     Pharynx: Posterior oropharyngeal erythema present.  Eyes:     Conjunctiva/sclera: Conjunctivae normal.     Pupils: Pupils are equal, round, and reactive to light.  Cardiovascular:     Rate and Rhythm: Normal rate and regular rhythm.  Pulmonary:     Effort: Pulmonary effort is normal. No respiratory distress.     Breath sounds: Normal breath sounds. No rhonchi.  Abdominal:     General: There is no distension.     Palpations: Abdomen is soft.  Musculoskeletal:        General: Normal range of motion.     Cervical back: Normal range of motion.  Lymphadenopathy:     Cervical: No cervical adenopathy.  Skin:    General: Skin is warm and dry.  Neurological:     Mental Status: She is alert.  Psychiatric:        Mood and Affect: Mood normal.        Behavior: Behavior normal.     UC Treatments / Results  Labs (all labs ordered are listed, but only abnormal results are displayed) Labs Reviewed  POC INFLUENZA A AND B ANTIGEN (URGENT CARE ONLY)    EKG   Radiology No results found.  Procedures Procedures (including critical care time)  Medications Ordered in UC Medications - No data to display  Initial Impression /  Assessment and Plan / UC Course  I have reviewed the triage vital signs and the nursing notes.  Pertinent labs & imaging results that were available during my care of the patient were reviewed by me and considered in  my medical decision making (see chart for details).     Viral illness discussed Final Clinical Impressions(s) / UC Diagnoses   Final diagnoses:  Viral illness     Discharge Instructions      Influenza is negative Drink lots of fluids Take over-the-counter Tylenol, Advil, or Aleve for any aches and fatigue Call if worse instead of better  Off work until Monday     ED Prescriptions   None    PDMP not reviewed this encounter.   Raylene Everts, MD 07/20/21 [phone]

## 2021-09-26 ENCOUNTER — Telehealth: Payer: Self-pay | Admitting: Neurology

## 2021-09-26 NOTE — Telephone Encounter (Signed)
Received FMLA forms on patient. Called patient to see if these are yearly forms for Migraines or if for something else? Haven't seen patient since June and overdue for her 6 month follow up. Needs appt. LMOM for patient, Awaiting call back.

## 2021-10-08 ENCOUNTER — Ambulatory Visit (INDEPENDENT_AMBULATORY_CARE_PROVIDER_SITE_OTHER): Payer: 59 | Admitting: Physician Assistant

## 2021-10-08 ENCOUNTER — Other Ambulatory Visit: Payer: Self-pay

## 2021-10-08 ENCOUNTER — Encounter: Payer: Self-pay | Admitting: Physician Assistant

## 2021-10-08 ENCOUNTER — Other Ambulatory Visit (HOSPITAL_COMMUNITY)
Admission: RE | Admit: 2021-10-08 | Discharge: 2021-10-08 | Disposition: A | Discharge status: Home / Self Care | Payer: 59 | Source: Ambulatory Visit | Attending: Physician Assistant | Admitting: Physician Assistant

## 2021-10-08 VITALS — BP 126/70 | HR 95 | Ht 68.0 in | Wt 280.5 lb

## 2021-10-08 DIAGNOSIS — R87618 Other abnormal cytological findings on specimens from cervix uteri: Secondary | ICD-10-CM | POA: Insufficient documentation

## 2021-10-08 DIAGNOSIS — Z124 Encounter for screening for malignant neoplasm of cervix: Secondary | ICD-10-CM | POA: Diagnosis not present

## 2021-10-08 DIAGNOSIS — F419 Anxiety disorder, unspecified: Secondary | ICD-10-CM

## 2021-10-08 DIAGNOSIS — Z1159 Encounter for screening for other viral diseases: Secondary | ICD-10-CM

## 2021-10-08 DIAGNOSIS — F339 Major depressive disorder, recurrent, unspecified: Secondary | ICD-10-CM

## 2021-10-08 DIAGNOSIS — Z6841 Body Mass Index (BMI) 40.0 and over, adult: Secondary | ICD-10-CM

## 2021-10-08 DIAGNOSIS — Z0000 Encounter for general adult medical examination without abnormal findings: Secondary | ICD-10-CM | POA: Diagnosis not present

## 2021-10-08 DIAGNOSIS — G43111 Migraine with aura, intractable, with status migrainosus: Secondary | ICD-10-CM

## 2021-10-08 MED ORDER — NURTEC 75 MG PO TBDP: 1.0000 | ORAL_TABLET | ORAL | 5 refills | Status: DC | PRN

## 2021-10-08 MED ORDER — WEGOVY 0.25 MG/0.5ML ~~LOC~~ SOAJ: 0.2500 mg | SUBCUTANEOUS | 0 refills | Status: DC

## 2021-10-08 MED ORDER — VORTIOXETINE HBR 20 MG PO TABS: 20.0000 mg | ORAL_TABLET | Freq: Every day | ORAL | 5 refills | Status: DC

## 2021-10-08 NOTE — Progress Notes (Signed)
Subjective:     Shelby Cook is a 36 y.o. female and is here for a comprehensive physical exam. The patient reports . She is doing ok. Her mood is still not to goal. She is having more down mood but moments of anxiety as well. Her migraines have increased as well. She is having at least 4 a month and maxalt is not rescuing anymore.   Social History   Socioeconomic History   Marital status: Married    Spouse name: Not on file   Number of children: 0   Years of education: Not on file   Highest education level: Not on file  Occupational History    Employer: [employer]  Tobacco Use   Smoking status: Former    Packs/day: 0.25    Types: Cigarettes   Smokeless tobacco: Never  Vaping Use   Vaping Use: Some days   Substances: Nicotine, Flavoring  Substance and Sexual Activity   Alcohol use: Yes    Alcohol/week: 0.0 standard drinks   Drug use: No   Sexual activity: Yes    Birth control/protection: None  Other Topics Concern   Not on file  Social History Narrative   Regular exercise yes, joining [employer]   Diet: Avoiding sugar,fruits and veggies   Single 6 month relationship no sex   Social Determinants of Radio broadcast assistant Strain: Not on file  Food Insecurity: Not on file  Transportation Needs: Not on file  Physical Activity: Not on file  Stress: Not on file  Social Connections: Not on file  Intimate Partner Violence: Not on file   Health Maintenance  Topic Date Due   Hepatitis C Screening  Never done   COVID-19 Vaccine (3 - Booster for Shelby Cook series) 12/20/2019   INFLUENZA VACCINE  04/16/2021   PAP SMEAR-Modifier  09/25/2021   TETANUS/TDAP  05/25/2024   HIV Screening  Completed   HPV VACCINES  Aged Out    The following portions of the patient's history were reviewed and updated as appropriate: allergies, current medications, past family history, past medical history, past social history, past surgical history, and problem list.  Review of Systems A  comprehensive review of systems was negative.   Objective:    BP 126/70    Pulse 95    Ht 5\' 8"  (1.727 m)    Wt 280 lb 8 oz (127.2 kg)    SpO2 99%    BMI 42.65 kg/m  General appearance: alert, cooperative, appears stated age, and morbidly obese Head: Normocephalic, without obvious abnormality, atraumatic Eyes: conjunctivae/corneas clear. PERRL, EOM's intact. Fundi benign. Ears: normal TM's and external ear canals both ears Nose: Nares normal. Septum midline. Mucosa normal. No drainage or sinus tenderness. Throat: lips, mucosa, and tongue normal; teeth and gums normal Neck: no adenopathy, no carotid bruit, no JVD, supple, symmetrical, trachea midline, and thyroid not enlarged, symmetric, no tenderness/mass/nodules Back: symmetric, no curvature. ROM normal. No CVA tenderness. Lungs: clear to auscultation bilaterally Breasts: normal appearance, no masses or tenderness Heart: regular rate and rhythm, S1, S2 normal, no murmur, click, rub or gallop Abdomen: soft, non-tender; bowel sounds normal; no masses,  no organomegaly Pelvic: cervix normal in appearance, external genitalia normal, no adnexal masses or tenderness, no cervical motion tenderness, uterus normal size, shape, and consistency, and vagina normal without discharge Extremities: extremities normal, atraumatic, no cyanosis or edema Pulses: 2+ and symmetric Skin: Skin color, texture, turgor normal. No rashes or lesions Lymph nodes: Cervical, supraclavicular, and axillary nodes normal. Neurologic: Grossly  normal   .. Depression screen [facility] 2/9 10/08/2021 02/19/2021 09/25/2020 08/28/2020 03/27/2020  Decreased Interest 1 0 1 1 1   Down, Depressed, Hopeless 1 1 2 1 2   PHQ - 2 Score 2 1 3 2 3   Altered sleeping 2 2 - 2 2  Tired, decreased energy 2 1 - 1 2  Change in appetite 2 1 - 1 1  Feeling bad or failure about yourself  3 0 - 1 0  Trouble concentrating 1 1 - 0 3  Moving slowly or fidgety/restless 1 0 - 0 2  Suicidal thoughts 1 0 - 0 0   PHQ-9 Score 14 6 - 7 13  Difficult doing work/chores Somewhat difficult Somewhat difficult - Very difficult Very difficult  Some recent data might be hidden   .Marland Kitchen GAD 7 : Generalized Anxiety Score 10/08/2021 02/19/2021 08/28/2020 03/27/2020  Nervous, Anxious, on Edge 1 1 1 1   Control/stop worrying 1 0 1 1  Worry too much - different things 1 0 1 1  Trouble relaxing 2 2 1 1   Restless 1 2 0 1  Easily annoyed or irritable 2 2 2 2   Afraid - awful might happen 2 0 2 2  Total GAD 7 Score 10 7 8 9   Anxiety Difficulty Somewhat difficult Somewhat difficult Somewhat difficult Very difficult     Assessment:    Healthy female exam.     Plan:    Marland KitchenMarland KitchenLajune was seen today for annual exam.  Diagnoses and all orders for this visit:  Routine physical examination -     TSH -     Lipid Panel w/reflex Direct LDL -     COMPLETE METABOLIC PANEL WITH GFR -     CBC with Differential/Platelet -     Hepatitis C Antibody  Class 3 severe obesity due to excess calories without serious comorbidity with body mass index (BMI) of 40.0 to 44.9 in adult (HCC) -     Semaglutide-Weight Management (WEGOVY) 0.25 MG/0.5ML SOAJ; Inject 0.25 mg into the skin once a week.  Routine Papanicolaou smear  Pap smear abnormality of cervix/human papillomavirus (HPV) positive -     Cytology - PAP  Encounter for hepatitis C screening test for low risk patient -     Hepatitis C Antibody  Intractable migraine with aura with status migrainosus -     Rimegepant Sulfate (NURTEC) 75 MG TBDP; Take 1 tablet by mouth as needed.  Depression, recurrent (HCC) -     vortioxetine HBr (TRINTELLIX) 20 MG TABS tablet; Take 1 tablet (20 mg total) by mouth daily.  Anxiety -     vortioxetine HBr (TRINTELLIX) 20 MG TABS tablet; Take 1 tablet (20 mg total) by mouth daily.  .. Discussed 150 minutes of exercise a week.  Encouraged vitamin D 1000 units and Calcium 1300mg  or 4 servings of dairy a day.  Fasting labs ordered today.  PHQ and  GAD numbers are not to goal Increased trintellix to 20mg  daily.  For migraines stop maxalt and try nurtec for rescue. If having more than 6 migraines a month need to consider preventative.  Pap done today.  Flu shot declined.  Covid vaccine UTD.    See After Visit Summary for Counseling Recommendations

## 2021-10-08 NOTE — Patient Instructions (Signed)

## 2021-10-09 ENCOUNTER — Encounter: Payer: Self-pay | Admitting: Physician Assistant

## 2021-10-09 NOTE — Progress Notes (Signed)
Vayda,   Cholesterol looks good.  Continue to work on exercise to increase HDL and limited fried/fatty foods to get to optimal LDL level of under 100.  Glucose is 121. Concerning for diabetes. Will get A1C to take a better look.  Kidney and liver look good.  Thyroid looks great.  WBC just a little elevated but seems to be due to allergies. You are taking your allergy medications daily?  Hep C screening negative.

## 2021-10-10 ENCOUNTER — Other Ambulatory Visit: Payer: Self-pay | Admitting: Physician Assistant

## 2021-10-10 ENCOUNTER — Telehealth: Payer: Self-pay

## 2021-10-10 DIAGNOSIS — R87810 Cervical high risk human papillomavirus (HPV) DNA test positive: Secondary | ICD-10-CM

## 2021-10-10 NOTE — Telephone Encounter (Signed)
Medication: Rimegepant Sulfate (NURTEC) 75 MG TBDP Prior authorization submitted via CoverMyMeds on 10/10/2021 PA submission pending

## 2021-10-10 NOTE — Progress Notes (Signed)
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 I am going to send you to GYN for biopsy consideration.

## 2021-10-12 ENCOUNTER — Encounter: Payer: Self-pay | Admitting: Physician Assistant

## 2021-10-12 DIAGNOSIS — R7303 Prediabetes: Secondary | ICD-10-CM | POA: Insufficient documentation

## 2021-10-12 LAB — COMPLETE METABOLIC PANEL WITH GFR
AG Ratio: 1.4 (calc) (ref 1.0–2.5)
ALT: 18 U/L (ref 6–29)
AST: 18 U/L (ref 10–30)
Albumin: 3.9 g/dL (ref 3.6–5.1)
Alkaline phosphatase (APISO): 81 U/L (ref 31–125)
BUN: 9 mg/dL (ref 7–25)
CO2: 28 mmol/L (ref 20–32)
Calcium: 9 mg/dL (ref 8.6–10.2)
Chloride: 104 mmol/L (ref 98–110)
Creat: 0.83 mg/dL (ref 0.50–0.97)
Globulin: 2.8 g/dL (calc) (ref 1.9–3.7)
Glucose, Bld: 121 mg/dL — ABNORMAL HIGH (ref 65–99)
Potassium: 4.3 mmol/L (ref 3.5–5.3)
Sodium: 138 mmol/L (ref 135–146)
Total Bilirubin: 0.4 mg/dL (ref 0.2–1.2)
Total Protein: 6.7 g/dL (ref 6.1–8.1)
eGFR: 95 mL/min/{1.73_m2} (ref >=60)

## 2021-10-12 LAB — LIPID PANEL W/REFLEX DIRECT LDL
Cholesterol: 175 mg/dL (ref <200)
HDL: 47 mg/dL — ABNORMAL LOW (ref >=50)
LDL Cholesterol (Calc): 108 mg/dL (calc) — ABNORMAL HIGH
Non-HDL Cholesterol (Calc): 128 mg/dL (calc) (ref <130)
Total CHOL/HDL Ratio: 3.7 (calc) (ref <5.0)
Triglycerides: 104 mg/dL (ref <150)

## 2021-10-12 LAB — CYTOLOGY - PAP
Comment: NEGATIVE
Comment: NEGATIVE
Diagnosis: UNDETERMINED — AB
HPV 16: NEGATIVE
HPV 18 / 45: NEGATIVE
High risk HPV: POSITIVE — AB

## 2021-10-12 LAB — CBC WITH DIFFERENTIAL/PLATELET
Absolute Monocytes: 539 cells/uL (ref 200–950)
Basophils Absolute: 55 cells/uL (ref 0–200)
Basophils Relative: 0.5 %
Eosinophils Absolute: 682 cells/uL — ABNORMAL HIGH (ref 15–500)
Eosinophils Relative: 6.2 %
HCT: 41.7 % (ref 35.0–45.0)
Hemoglobin: 14.2 g/dL (ref 11.7–15.5)
Lymphs Abs: 2651 cells/uL (ref 850–3900)
MCH: 31.6 pg (ref 27.0–33.0)
MCHC: 34.1 g/dL (ref 32.0–36.0)
MCV: 92.9 fL (ref 80.0–100.0)
MPV: 10.2 fL (ref 7.5–12.5)
Monocytes Relative: 4.9 %
Neutro Abs: 7073 cells/uL (ref 1500–7800)
Neutrophils Relative %: 64.3 %
Platelets: 399 10*3/uL (ref 140–400)
RBC: 4.49 10*6/uL (ref 3.80–5.10)
RDW: 12.4 % (ref 11.0–15.0)
Total Lymphocyte: 24.1 %
WBC: 11 10*3/uL — ABNORMAL HIGH (ref 3.8–10.8)

## 2021-10-12 LAB — HEPATITIS C ANTIBODY
Hepatitis C Ab: NONREACTIVE
SIGNAL TO CUT-OFF: 0.05 (ref <1.00)

## 2021-10-12 LAB — HGB A1C W/ CALC MEAN PLASMA GLUCOSE
Hgb A1c MFr Bld: 5.9 % of total Hgb — ABNORMAL HIGH (ref <5.7)
Mean Plasma Glucose (calc): 133 mg/dL (calc)

## 2021-10-12 LAB — TSH: TSH: 2.23 mIU/L

## 2021-10-12 NOTE — Progress Notes (Signed)
A1C is in pre-diabetes range but not diabetes. Working on limiting carbs and sugars. Exercising 150 minutes a week and losing weight all can help stop you from progressing into diabetes. Recheck in 6 months.

## 2021-10-17 ENCOUNTER — Emergency Department
Admission: RE | Admit: 2021-10-17 | Discharge: 2021-10-17 | Disposition: A | Discharge status: Home / Self Care | Payer: 59 | Source: Ambulatory Visit

## 2021-10-17 ENCOUNTER — Other Ambulatory Visit: Payer: Self-pay

## 2021-10-17 VITALS — BP 124/81 | HR 76 | Temp 98.4°F | Resp 18 | Ht 67.0 in | Wt 272.0 lb

## 2021-10-17 DIAGNOSIS — J3489 Other specified disorders of nose and nasal sinuses: Secondary | ICD-10-CM

## 2021-10-17 DIAGNOSIS — J0100 Acute maxillary sinusitis, unspecified: Secondary | ICD-10-CM

## 2021-10-17 MED ORDER — AMOXICILLIN-POT CLAVULANATE 875-125 MG PO TABS: 1.0000 | ORAL_TABLET | Freq: Two times a day (BID) | ORAL | 0 refills | Status: DC

## 2021-10-17 MED ORDER — PREDNISONE 20 MG PO TABS: ORAL_TABLET | ORAL | 0 refills | Status: DC

## 2021-10-17 NOTE — ED Provider Notes (Signed)
Shelby Cook CARE    CSN: [id number] Arrival date & time: 10/17/21  1450      History   Chief Complaint Chief Complaint  Patient presents with   Cough    Cough, sore throat and body aches. X5 days    HPI Shelby Cook is a 36 y.o. female.   HPI 36 year old female presents with cough sore throat and body aches for 5 days.  Reports negative COVID PCR with her job on Tuesday, yesterday 10/16/2021.  PMH significant for intractable migraine with aura with status migrainosus and obesity.  Past Medical History:  Diagnosis Date   Allergy    Vaginal Pap smear, abnormal     Patient Active Problem List   Diagnosis Date Noted   Pre-diabetes 10/12/2021   NASH (nonalcoholic steatohepatitis) 12/04/2020   Pap smear abnormality of cervix/human papillomavirus (HPV) positive 10/03/2020   Hives 03/28/2020   Depression, recurrent ([place]) 03/28/2020   Worsening headaches 03/28/2020   Intractable migraine with aura with status migrainosus 03/28/2020   Trouble in sleeping 03/28/2020   History of abnormal cervical Pap smear 04/19/2019   Encounter for smoking cessation counseling 04/03/2018   Need for rhogam due to Rh negative mother 11/19/2017   Class 3 severe obesity due to excess calories without serious comorbidity with body mass index (BMI) of 40.0 to 44.9 in adult [facility]) 10/23/2015   Environmental allergies 08/18/2015   Seasonal allergies 08/04/2015   Anxiety 08/04/2015   Multiple allergies 08/04/2015   LGSIL (low grade squamous intraepithelial dysplasia) 07/27/2008   Allergic rhinitis 02/16/2007   OLIGOMENORRHEA 02/16/2007   ACNE NEC 02/16/2007    Past Surgical History:  Procedure Laterality Date   WISDOM TOOTH EXTRACTION      OB History     Gravida  1   Para      Term      Preterm      AB  1   Living         SAB  1   IAB      Ectopic      Multiple      Live Births               Home Medications    Prior to Admission medications    Medication Sig Start Date End Date Taking? Authorizing Provider  amoxicillin-clavulanate (AUGMENTIN) 875-125 MG tablet Take 1 tablet by mouth every 12 (twelve) hours. 10/17/21  Yes Eliezer Lofts, FNP  cetirizine (ZYRTEC) 5 MG tablet Take 5 mg by mouth daily. Take 2 in am  & 1 in pm   Yes [provider]  diphenhydrAMINE (BENADRYL) 25 MG tablet Take 25 mg by mouth at bedtime.   Yes [provider]  loratadine (CLARITIN) 10 MG tablet Take 10 mg by mouth daily.   Yes [provider]  montelukast (SINGULAIR) 10 MG tablet TAKE 1 TABLET BY MOUTH EVERYDAY AT BEDTIME 01/08/21  Yes Breeback, Jade L, PA-C  predniSONE (DELTASONE) 20 MG tablet Take 3 tabs PO daily x 5 days. 10/17/21  Yes Eliezer Lofts, FNP  Rimegepant Sulfate (NURTEC) 75 MG TBDP Take 1 tablet by mouth as needed. 10/08/21  Yes Breeback, Jade L, PA-C  Semaglutide-Weight Management (WEGOVY) 0.25 MG/0.5ML SOAJ Inject 0.25 mg into the skin once a week. 10/08/21  Yes Breeback, Jade L, PA-C  vortioxetine HBr (TRINTELLIX) 20 MG TABS tablet Take 1 tablet (20 mg total) by mouth daily. 10/08/21  Yes Donella Stade, PA-C    Family History Family History  Problem Relation Age of Onset   Drug abuse Father    Mental illness Father        bipolar   Diabetes Father    Hypertension Father    Heart attack Father    Osteoporosis Mother    Scoliosis Sister    Cancer Paternal Grandmother        breast and lung   Diabetes Paternal Grandfather    Heart attack Paternal Grandfather    Allergies Sister     Social History Social History   Tobacco Use   Smoking status: Former    Packs/day: 0.25    Types: Cigarettes   Smokeless tobacco: Never  Vaping Use   Vaping Use: Some days   Substances: Nicotine, Flavoring  Substance Use Topics   Alcohol use: Yes    Alcohol/week: 0.0 standard drinks   Drug use: No     Allergies   Celexa [citalopram hydrobromide] and Flonase [fluticasone propionate]   Review of Systems Review  of Systems  HENT:  Positive for congestion and sore throat.   Respiratory:  Positive for cough.   Musculoskeletal:  Positive for myalgias.  All other systems reviewed and are negative.   Physical Exam Triage Vital Signs ED Triage Vitals  Enc Vitals Group     BP 10/17/21 1503 124/81     Pulse Rate 10/17/21 1503 76     Resp 10/17/21 1503 18     Temp 10/17/21 1503 98.4 F (36.9 C)     Temp Source 10/17/21 1503 Oral     SpO2 10/17/21 1503 97 %     Weight 10/17/21 1501 272 lb (123.4 kg)     Height 10/17/21 1501 5\' 7"  (1.702 m)     Head Circumference --      Peak Flow --      Pain Score 10/17/21 1501 6     Pain Loc --      Pain Edu? --      Excl. in [place]? --    No data found.  Updated Vital Signs BP 124/81 (BP Location: Left Arm)    Pulse 76    Temp 98.4 F (36.9 C) (Oral)    Resp 18    Ht 5\' 7"  (1.702 m)    Wt 272 lb (123.4 kg)    SpO2 97%    BMI 42.60 kg/m      Physical Exam Vitals and nursing note reviewed.  Constitutional:      General: She is not in acute distress.    Appearance: Normal appearance. She is obese. She is not ill-appearing.  HENT:     Head: Normocephalic and atraumatic.     Right Ear: Tympanic membrane and external ear normal.     Left Ear: Tympanic membrane and external ear normal.     Ears:     Comments: Moderate eustachian tube dysfunction noted bilaterally    Nose:     Right Sinus: Maxillary sinus tenderness present.     Left Sinus: Maxillary sinus tenderness present.     Comments: Turbinates are erythematous/edematous    Mouth/Throat:     Mouth: Mucous membranes are moist.     Pharynx: Oropharynx is clear.  Eyes:     Extraocular Movements: Extraocular movements intact.     Conjunctiva/sclera: Conjunctivae normal.     Pupils: Pupils are equal, round, and reactive to light.  Cardiovascular:     Rate and Rhythm: Normal rate and regular rhythm.     Pulses: Normal pulses.  Heart sounds: Normal heart sounds.  Pulmonary:     Effort: Pulmonary  effort is normal.     Breath sounds: Normal breath sounds.  Musculoskeletal:     Cervical back: Normal range of motion and neck supple.  Skin:    General: Skin is warm and dry.  Neurological:     General: No focal deficit present.     Mental Status: She is alert and oriented to person, place, and time.     UC Treatments / Results  Labs (all labs ordered are listed, but only abnormal results are displayed) Labs Reviewed - No data to display  EKG   Radiology No results found.  Procedures Procedures (including critical care time)  Medications Ordered in UC Medications - No data to display  Initial Impression / Assessment and Plan / UC Course  I have reviewed the triage vital signs and the nursing notes.  Pertinent labs & imaging results that were available during my care of the patient were reviewed by me and considered in my medical decision making (see chart for details).     MDM: Acute maxillary sinusitis, recurrence not specified-Rx'd Augmentin; 2.  Sinus pressure-Rx'd Prednisone. Advised patient to take medication as directed with food to completion.  Advised patient to take Prednisone with first dose of Augmentin for the next 5 of 7 days.  Encouraged patient to increase daily water intake while taking these medications.  Work note provided prior to discharge per patient request.  Patient discharged home, hemodynamically stable. Final Clinical Impressions(s) / UC Diagnoses   Final diagnoses:  Acute maxillary sinusitis, recurrence not specified  Sinus pressure     Discharge Instructions      Advised patient to take medication as directed with food to completion.  Advised patient to take Prednisone with first dose of Augmentin for the next 5 of 7 days.  Encouraged patient to increase daily water intake while taking these medications.     ED Prescriptions     Medication Sig Dispense Auth. Provider   amoxicillin-clavulanate (AUGMENTIN) 875-125 MG tablet Take 1  tablet by mouth every 12 (twelve) hours. 14 tablet Eliezer Lofts, FNP   predniSONE (DELTASONE) 20 MG tablet Take 3 tabs PO daily x 5 days. 15 tablet Eliezer Lofts, FNP      PDMP not reviewed this encounter.   Eliezer Lofts, [place] 10/17/21 [phone]

## 2021-10-17 NOTE — ED Triage Notes (Signed)
Pt states that she has a cough, sore throat, and body aches. X5 days  Pt states that she received covid pcr with her job which was negative on 1/31.  Pt states that she is vaccinated for covid. Pt states that she has had flu vaccine.

## 2021-10-17 NOTE — Discharge Instructions (Addendum)
Advised patient to take medication as directed with food to completion.  Advised patient to take Prednisone with first dose of Augmentin for the next 5 of 7 days.  Encouraged patient to increase daily water intake while taking these medications. °

## 2021-10-18 ENCOUNTER — Encounter: Payer: Self-pay | Admitting: Physician Assistant

## 2021-10-18 DIAGNOSIS — R7303 Prediabetes: Secondary | ICD-10-CM

## 2021-10-18 DIAGNOSIS — B3731 Acute candidiasis of vulva and vagina: Secondary | ICD-10-CM

## 2021-10-18 DIAGNOSIS — G43111 Migraine with aura, intractable, with status migrainosus: Secondary | ICD-10-CM

## 2021-10-18 DIAGNOSIS — K7581 Nonalcoholic steatohepatitis (NASH): Secondary | ICD-10-CM

## 2021-10-31 ENCOUNTER — Other Ambulatory Visit: Payer: Self-pay

## 2021-10-31 ENCOUNTER — Ambulatory Visit: Payer: 59 | Admitting: Physician Assistant

## 2021-10-31 VITALS — BP 99/81 | HR 83 | Ht 68.0 in | Wt 278.0 lb

## 2021-10-31 DIAGNOSIS — K7581 Nonalcoholic steatohepatitis (NASH): Secondary | ICD-10-CM | POA: Diagnosis not present

## 2021-10-31 DIAGNOSIS — N898 Other specified noninflammatory disorders of vagina: Secondary | ICD-10-CM | POA: Diagnosis not present

## 2021-10-31 DIAGNOSIS — R7303 Prediabetes: Secondary | ICD-10-CM

## 2021-10-31 DIAGNOSIS — G43111 Migraine with aura, intractable, with status migrainosus: Secondary | ICD-10-CM | POA: Diagnosis not present

## 2021-10-31 DIAGNOSIS — B3731 Acute candidiasis of vulva and vagina: Secondary | ICD-10-CM | POA: Diagnosis not present

## 2021-10-31 DIAGNOSIS — Z6841 Body Mass Index (BMI) 40.0 and over, adult: Secondary | ICD-10-CM

## 2021-10-31 MED ORDER — FLUCONAZOLE 150 MG PO TABS: 150.0000 mg | ORAL_TABLET | Freq: Once | ORAL | 0 refills | Status: AC

## 2021-10-31 MED ORDER — MOUNJARO 2.5 MG/0.5ML ~~LOC~~ SOAJ: 2.5000 mg | SUBCUTANEOUS | 0 refills | Status: DC

## 2021-10-31 MED ORDER — DEXAMETHASONE SODIUM PHOSPHATE 10 MG/ML IJ SOLN
10.0000 mg | Freq: Once | INTRAMUSCULAR | Status: AC
  Administered 2021-10-31: 10 mg via INTRAMUSCULAR

## 2021-10-31 MED ORDER — KETOROLAC TROMETHAMINE 60 MG/2ML IM SOLN
60.0000 mg | Freq: Once | INTRAMUSCULAR | Status: AC
  Administered 2021-10-31: 60 mg via INTRAMUSCULAR

## 2021-10-31 MED ORDER — NURTEC 75 MG PO TBDP: 1.0000 | ORAL_TABLET | ORAL | 5 refills | Status: DC

## 2021-10-31 MED ORDER — EMGALITY 120 MG/ML ~~LOC~~ SOAJ: 120.0000 mg | SUBCUTANEOUS | 2 refills | Status: DC

## 2021-11-01 LAB — WET PREP FOR TRICH, YEAST, CLUE
MICRO NUMBER:: 13017941
Specimen Quality: ADEQUATE

## 2021-11-01 NOTE — Progress Notes (Signed)
Confirmed yeast. Diflucan already sent.

## 2021-11-02 ENCOUNTER — Encounter: Payer: Self-pay | Admitting: Physician Assistant

## 2021-11-02 NOTE — Progress Notes (Signed)
Subjective:    Patient ID: Shelby Cook, female    DOB: May 08, 1986, 36 y.o.   MRN: [id number]  HPI Pt is a 36 yo female who presents to the clinic with suspected yeast infection after augmentin for sinusitis. She is having vaginal irritation, itching, and whitish discharge. No dysuria, urinary frequency or urgency, flank or abdominal pain. Not tried anything OTC.   Insurance would not pay for nurteq or wegovy given at last visit. Her friend suggested she try mounjaro with coupon. Pt does have pre-diabetes and NASH as risk factors.   She is having more migraine days and has a migraine for the last 3 days that OTC excedrin has not helped.   .. Active Ambulatory Problems    Diagnosis Date Noted   Allergic rhinitis 02/16/2007   OLIGOMENORRHEA 02/16/2007   ACNE NEC 02/16/2007   LGSIL (low grade squamous intraepithelial dysplasia) 07/27/2008   Seasonal allergies 08/04/2015   Anxiety 08/04/2015   Multiple allergies 08/04/2015   Environmental allergies 08/18/2015   Class 3 severe obesity due to excess calories without serious comorbidity with body mass index (BMI) of 40.0 to 44.9 in adult [facility]) 10/23/2015   Need for rhogam due to Rh negative mother 11/19/2017   Encounter for smoking cessation counseling 04/03/2018   History of abnormal cervical Pap smear 04/19/2019   Hives 03/28/2020   Depression, recurrent ([place]) 03/28/2020   Worsening headaches 03/28/2020   Intractable migraine with aura with status migrainosus 03/28/2020   Trouble in sleeping 03/28/2020   Pap smear abnormality of cervix/human papillomavirus (HPV) positive 10/03/2020   NASH (nonalcoholic steatohepatitis) 12/04/2020   Pre-diabetes 10/12/2021   Resolved Ambulatory Problems    Diagnosis Date Noted   OBESITY NOS 02/16/2007   Headache(784.0) 07/25/2010   Right knee pain 10/23/2015   Abnormal weight gain 10/23/2015   Past Medical History:  Diagnosis Date   Allergy    Vaginal Pap smear, abnormal      Review  of Systems See HPI.     Objective:   Physical Exam Vitals reviewed.  Constitutional:      Appearance: Normal appearance. She is obese.  HENT:     Head: Normocephalic.  Cardiovascular:     Rate and Rhythm: Normal rate.  Pulmonary:     Effort: Pulmonary effort is normal.  Abdominal:     Tenderness: There is no right CVA tenderness or left CVA tenderness.  Neurological:     General: No focal deficit present.     Mental Status: She is alert and oriented to person, place, and time.  Psychiatric:        Mood and Affect: Mood normal.      .. Results for orders placed or performed in visit on 10/31/21  WET PREP FOR [place], YEAST, CLUE  Result Value Ref Range   MICRO NUMBER: [id number]    Specimen Quality Adequate    SOURCE: NOT GIVEN    Status FINAL    RESULT yeast (A)        Assessment & Plan:   Marland KitchenMarland KitchenEmyrson was seen today for dysuria.  Diagnoses and all orders for this visit:  Vaginal itching -     POCT URINALYSIS DIP (CLINITEK) -     WET PREP FOR TRICH, YEAST, CLUE  Yeast vaginitis -     WET PREP FOR TRICH, YEAST, CLUE  NASH (nonalcoholic steatohepatitis)  Pre-diabetes  Intractable migraine with aura with status migrainosus -     ketorolac (TORADOL) injection 60 mg -  dexamethasone (DECADRON) injection 10 mg   Diflucan given for suspected yeast after augmentin. Wet prep sent out.  Mounjaro written for obesity/pre-diabetes/NASH Toradol and decadron given IM today for migraine [facility]q [facility] to see if affordable. Take every other day for prevention.

## 2021-11-08 ENCOUNTER — Telehealth: Payer: Self-pay

## 2021-11-08 NOTE — Telephone Encounter (Addendum)
Initiated Prior authorization for: Rimegepant Sulfate (NURTEC) 75 MG TBDP Via: Covermymeds Case/Key: [id number] Status: denied  as of 11/20/21 Reason:quant limit 16 per month however,: Nurtec Tab 75mg  Odt has been approved for up to 8 per month until 11/13/2022 or until coverage for the medication is no longer available under your benefit plan  Notified Pt via: Mychart  Initiated Prior authorization [id number] 120MG /ML auto-injectors Via: Covermymeds Case/Key:[id number] Status: denied as of 11/08/21 Reason:The request for coverage for Emgality Inj 120mg /Ml, use as directed (1 per month), is denied. This decision is based on health plan criteria for Emgality. This medicine is covered only if: One of the following: (A) You have four to seven migraine days per month and at least one of the following: (I) Less than 15 headache days per month. (II) Provider attests this is your predominant headache diagnosis (that is, primary driver of headaches is not a different, non-migrainous condition). (B) Greater than or equal to eight migraine days per month. The information provided does not show that you meet the criteria listed above. Reviewed by: Ernest Pine, R.Ph. Notified Pt via: Mychart   Initiated Prior authorization [id number] 2.5MG /0.5ML pen-injectors Via: Covermymeds Case/Key:[id number] Status: approved as of 11/08/21 Reason:approved through 11/08/2022.  Notified Pt via: Mychart

## 2021-11-19 ENCOUNTER — Ambulatory Visit: Payer: 59 | Admitting: Obstetrics and Gynecology

## 2021-11-19 ENCOUNTER — Encounter: Payer: Self-pay | Admitting: Obstetrics and Gynecology

## 2021-11-19 ENCOUNTER — Other Ambulatory Visit: Payer: Self-pay

## 2021-11-19 ENCOUNTER — Other Ambulatory Visit (HOSPITAL_COMMUNITY)
Admission: RE | Admit: 2021-11-19 | Discharge: 2021-11-19 | Disposition: A | Discharge status: Home / Self Care | Payer: 59 | Source: Ambulatory Visit | Attending: Obstetrics and Gynecology | Admitting: Obstetrics and Gynecology

## 2021-11-19 VITALS — BP 123/74 | HR 86 | Ht 67.0 in | Wt 279.0 lb

## 2021-11-19 DIAGNOSIS — R87810 Cervical high risk human papillomavirus (HPV) DNA test positive: Secondary | ICD-10-CM

## 2021-11-19 DIAGNOSIS — R87610 Atypical squamous cells of undetermined significance on cytologic smear of cervix (ASC-US): Secondary | ICD-10-CM | POA: Diagnosis not present

## 2021-11-19 DIAGNOSIS — Z01812 Encounter for preprocedural laboratory examination: Secondary | ICD-10-CM | POA: Diagnosis not present

## 2021-11-19 LAB — POCT URINE PREGNANCY: Preg Test, Ur: NEGATIVE

## 2021-11-19 NOTE — Progress Notes (Signed)
Colposcopy Procedure Note ? ?Shelby Cook is a 36 y.o. G1P0010 here for colposcopy. ? ?Indications:  ?Abnormal pap x 3 years, most recents ASCUS + hrHPV ? ?Procedure Details  ?LMP 08/29/21; UPT negative.  ?  ?The risks (including infection, bleeding, pain) and benefits of the procedure were explained to the patient and written informed consent was obtained. ? ?The patient was placed in the dorsal lithotomy position. A Graces was speculum inserted in the vagina, and the cervix was visualized.  The cervix was stained with acetic acid and visualized using the colposcope under magnification as well as with a green filter. Findings as below. Cervical biopsies were taken at 3, 7. Endocervical curettage then performed in all four quadrants. Small amount of bleeding noted that improved with pressure. Patient tolerating procedure well. ? ?Findings: acetowhite at 3-9 o'clock, punctate lesion at 3, increased vascularity at 7 o'clock ? ?Impression: low grade ? ?Adequate: yes ? ?Specimens:  ?Cervical biopsy (3 & 7 o'clock) ?ECC ? ?Condition: Stable ? ?Complications: None ? ?Plan: ?The patient was advised to call for any fever or for prolonged or severe pain or bleeding. She was advised to use OTC analgesics as needed for mild to moderate pain. She was advised to avoid vaginal intercourse for 48 hours or until the bleeding has completely stopped. ? ?Will base further management on results of biopsy. ? ? ?K. Arvilla Meres, MD, FACOG ?Attending ?[facility] for [employer] [facility]) ? ? ? ? ? ? ?

## 2021-11-19 NOTE — Addendum Note (Signed)
Addended by: Asencion Islam on: 11/19/2021 10:58 AM ? ? Modules accepted: Orders ? ?

## 2021-11-20 NOTE — Telephone Encounter (Signed)
Ok I was using for preventative every other day but I guess we can try as needed first and then go from there. Make sure pt aware.

## 2021-11-21 LAB — SURGICAL PATHOLOGY

## 2021-12-05 ENCOUNTER — Other Ambulatory Visit: Payer: Self-pay | Admitting: Physician Assistant

## 2021-12-05 DIAGNOSIS — Z889 Allergy status to unspecified drugs, medicaments and biological substances status: Secondary | ICD-10-CM

## 2021-12-05 DIAGNOSIS — Z9109 Other allergy status, other than to drugs and biological substances: Secondary | ICD-10-CM

## 2021-12-18 ENCOUNTER — Other Ambulatory Visit: Payer: Self-pay | Admitting: Physician Assistant

## 2021-12-18 DIAGNOSIS — R7303 Prediabetes: Secondary | ICD-10-CM

## 2021-12-18 DIAGNOSIS — K7581 Nonalcoholic steatohepatitis (NASH): Secondary | ICD-10-CM

## 2021-12-18 MED ORDER — MOUNJARO 2.5 MG/0.5ML ~~LOC~~ SOAJ: 2.5000 mg | SUBCUTANEOUS | 0 refills | Status: DC

## 2022-06-10 ENCOUNTER — Encounter: Payer: Self-pay | Admitting: Physician Assistant

## 2022-06-10 ENCOUNTER — Ambulatory Visit: Payer: 59 | Admitting: Physician Assistant

## 2022-06-10 VITALS — BP 125/65 | HR 88 | Ht 67.0 in | Wt 269.0 lb

## 2022-06-10 DIAGNOSIS — F339 Major depressive disorder, recurrent, unspecified: Secondary | ICD-10-CM

## 2022-06-10 DIAGNOSIS — Z30011 Encounter for initial prescription of contraceptive pills: Secondary | ICD-10-CM

## 2022-06-10 DIAGNOSIS — Z6841 Body Mass Index (BMI) 40.0 and over, adult: Secondary | ICD-10-CM

## 2022-06-10 DIAGNOSIS — Z23 Encounter for immunization: Secondary | ICD-10-CM

## 2022-06-10 DIAGNOSIS — E66813 Obesity, class 3: Secondary | ICD-10-CM

## 2022-06-10 DIAGNOSIS — K7581 Nonalcoholic steatohepatitis (NASH): Secondary | ICD-10-CM | POA: Diagnosis not present

## 2022-06-10 DIAGNOSIS — F419 Anxiety disorder, unspecified: Secondary | ICD-10-CM

## 2022-06-10 DIAGNOSIS — R7303 Prediabetes: Secondary | ICD-10-CM

## 2022-06-10 LAB — POCT URINE PREGNANCY: Preg Test, Ur: NEGATIVE

## 2022-06-10 MED ORDER — NORGESTIM-ETH ESTRAD TRIPHASIC 0.18/0.215/0.25 MG-25 MCG PO TABS
1.0000 | ORAL_TABLET | Freq: Every day | ORAL | 3 refills | Status: DC
Start: 2022-06-10 — End: 2022-11-06

## 2022-06-10 MED ORDER — MOUNJARO 2.5 MG/0.5ML ~~LOC~~ SOAJ: 2.5000 mg | SUBCUTANEOUS | 0 refills | Status: DC

## 2022-06-10 MED ORDER — VORTIOXETINE HBR 20 MG PO TABS: 20.0000 mg | ORAL_TABLET | Freq: Every day | ORAL | 5 refills | Status: DC

## 2022-06-10 MED ORDER — TIRZEPATIDE 5 MG/0.5ML ~~LOC~~ SOAJ: 5.0000 mg | SUBCUTANEOUS | 0 refills | Status: DC

## 2022-06-10 NOTE — Progress Notes (Signed)
Established Patient Office Visit  Subjective   Patient ID: Shelby Cook, female    DOB: [birth year]  Age: 36 y.o. MRN: [id number]  Chief Complaint  Patient presents with   Follow-up    HPI Pt is a 36 yo obese female with Migraines, NASH, Depression, Anxiety, pre diabetes who presents to the clinic to restart birth control.   She left her husband yesterday after 1 year of counseling. She is ready to move on. She tolerated oral pills in the past and would like to try again. Her mood is overall hopeful for the future. She would like to take her animals for emotional support with her in apartment. She needs letter.   She was started on mounjaro and did well but was not refilled. Would like refilled to start back. She is working out and trying to eat right.     Review of Systems  All other systems reviewed and are negative.     Objective:     BP 125/65   Pulse 88   Ht 5' 7"  (1.702 m)   Wt 269 lb (122 kg)   SpO2 100%   BMI 42.13 kg/m  BP Readings from Last 3 Encounters:  06/10/22 125/65  11/19/21 123/74  10/31/21 99/81   Wt Readings from Last 3 Encounters:  06/10/22 269 lb (122 kg)  11/19/21 279 lb (126.6 kg)  10/31/21 278 lb (126.1 kg)      Physical Exam Constitutional:      Appearance: Normal appearance. She is obese.  HENT:     Head: Normocephalic.  Cardiovascular:     Rate and Rhythm: Normal rate.     Pulses: Normal pulses.  Pulmonary:     Effort: Pulmonary effort is normal.  Musculoskeletal:     Right lower leg: No edema.     Left lower leg: No edema.  Neurological:     General: No focal deficit present.     Mental Status: She is alert and oriented to person, place, and time.  Psychiatric:        Mood and Affect: Mood normal.      Results for orders placed or performed in visit on 06/10/22  POCT urine pregnancy  Result Value Ref Range   Preg Test, Ur Negative Negative     Assessment & Plan:  Marland KitchenMarland KitchenSymphanie was seen today for  follow-up.  Diagnoses and all orders for this visit:  Pre-diabetes -     tirzepatide [facility]) 2.5 MG/0.5ML Pen; Inject 2.5 mg into the skin once a week. -     tirzepatide [facility]) 5 MG/0.5ML Pen; Inject 5 mg into the skin once a week.  Encounter for initial prescription of contraceptive pills -     POCT urine pregnancy -     Norgestimate-Ethinyl Estradiol Triphasic (ORTHO TRI-CYCLEN LO) 0.18/0.215/0.25 MG-25 MCG tab; Take 1 tablet by mouth daily.  Flu vaccine need -     Flu Vaccine QUAD 69moIM (Fluarix, Fluzone & Alfiuria Quad PF)  NASH (nonalcoholic steatohepatitis) -     tirzepatide (MOUNJARO) 2.5 MG/0.5ML Pen; Inject 2.5 mg into the skin once a week. -     tirzepatide ([facility] 5 MG/0.5ML Pen; Inject 5 mg into the skin once a week.  Depression, recurrent (HCC) -     vortioxetine HBr (TRINTELLIX) 20 MG TABS tablet; Take 1 tablet (20 mg total) by mouth daily.  Anxiety -     vortioxetine HBr (TRINTELLIX) 20 MG TABS tablet; Take 1 tablet (20 mg total) by mouth daily.  Class 3 severe obesity due to excess calories with serious comorbidity and body mass index (BMI) of 40.0 to 44.9 in adult (HCC) -     tirzepatide (MOUNJARO) 2.5 MG/0.5ML Pen; Inject 2.5 mg into the skin once a week. -     tirzepatide [facility]) 5 MG/0.5ML Pen; Inject 5 mg into the skin once a week.   Letter for emotional support animals written Refilled trintellix  .Marland KitchenDiscussed low carb diet with 1500 calories and 80g of protein.  Exercising at least 150 minutes a week.  My Fitness Pal could be a Microbiologist.  Start mounjaro Follow up in 4 months  UPT negative Start OCP Discussed side effects and risk PAP UTD due in 09/2022. Follow up in 1 year  Flu shot given without any concern   Return in about 4 months (around 10/10/2022).    Iran Planas, PA-C

## 2022-06-18 ENCOUNTER — Encounter: Payer: Self-pay | Admitting: Physician Assistant

## 2022-06-19 NOTE — Telephone Encounter (Signed)
Forms completed, copy sent to scan, copy to hold. Given to front desk to email to patient.

## 2022-07-06 ENCOUNTER — Other Ambulatory Visit: Payer: Self-pay | Admitting: Physician Assistant

## 2022-07-06 DIAGNOSIS — R7303 Prediabetes: Secondary | ICD-10-CM

## 2022-07-06 DIAGNOSIS — K7581 Nonalcoholic steatohepatitis (NASH): Secondary | ICD-10-CM

## 2022-07-12 ENCOUNTER — Ambulatory Visit: Payer: 59 | Admitting: Physician Assistant

## 2022-07-23 NOTE — Telephone Encounter (Signed)
Resent email with attached letter 9:28 AM on 07/23/2022 to exact email provided. [name]

## 2022-07-25 ENCOUNTER — Telehealth: Payer: Self-pay | Admitting: Neurology

## 2022-07-25 NOTE — Telephone Encounter (Signed)
Melissa with [employer] [phone]) called and LVM wanting [place] to verify we wrote a letter for patient to have emotional support animals.   Called back and LVM letting them know letter was written by [place] and to call with any further refills.

## 2022-08-13 ENCOUNTER — Encounter: Payer: Self-pay | Admitting: Physician Assistant

## 2022-09-12 ENCOUNTER — Other Ambulatory Visit: Payer: Self-pay | Admitting: Physician Assistant

## 2022-09-12 DIAGNOSIS — K7581 Nonalcoholic steatohepatitis (NASH): Secondary | ICD-10-CM

## 2022-09-12 DIAGNOSIS — R7303 Prediabetes: Secondary | ICD-10-CM

## 2022-10-07 ENCOUNTER — Ambulatory Visit: Payer: 59 | Admitting: Physician Assistant

## 2022-10-07 ENCOUNTER — Encounter: Payer: Self-pay | Admitting: Physician Assistant

## 2022-10-07 VITALS — BP 118/68 | HR 76 | Ht 67.0 in | Wt 256.0 lb

## 2022-10-07 DIAGNOSIS — Z13220 Encounter for screening for lipoid disorders: Secondary | ICD-10-CM

## 2022-10-07 DIAGNOSIS — G43111 Migraine with aura, intractable, with status migrainosus: Secondary | ICD-10-CM

## 2022-10-07 DIAGNOSIS — Z1329 Encounter for screening for other suspected endocrine disorder: Secondary | ICD-10-CM

## 2022-10-07 DIAGNOSIS — Z889 Allergy status to unspecified drugs, medicaments and biological substances status: Secondary | ICD-10-CM

## 2022-10-07 DIAGNOSIS — Z9109 Other allergy status, other than to drugs and biological substances: Secondary | ICD-10-CM

## 2022-10-07 DIAGNOSIS — Z6841 Body Mass Index (BMI) 40.0 and over, adult: Secondary | ICD-10-CM

## 2022-10-07 DIAGNOSIS — K7581 Nonalcoholic steatohepatitis (NASH): Secondary | ICD-10-CM

## 2022-10-07 DIAGNOSIS — Z79899 Other long term (current) drug therapy: Secondary | ICD-10-CM

## 2022-10-07 DIAGNOSIS — R7303 Prediabetes: Secondary | ICD-10-CM

## 2022-10-07 MED ORDER — MONTELUKAST SODIUM 10 MG PO TABS: ORAL_TABLET | ORAL | 3 refills | Status: DC

## 2022-10-07 MED ORDER — NURTEC 75 MG PO TBDP: 1.0000 | ORAL_TABLET | ORAL | 5 refills | Status: DC

## 2022-10-07 MED ORDER — TIRZEPATIDE 5 MG/0.5ML ~~LOC~~ SOAJ: 5.0000 mg | SUBCUTANEOUS | 1 refills | Status: DC

## 2022-10-07 NOTE — Progress Notes (Signed)
Established Patient Office Visit  Subjective   Patient ID: Shelby Cook, female    DOB: 1986/02/12  Age: 37 y.o. MRN: [id number]  Chief Complaint  Patient presents with   Follow-up    HPI Pt is a 37 yo obese female with NASH, pre-diabetes, migraines, MDD who presents to the clinic for medication refills.   She loved mounjaro and had lost 30lbs but then ran out of rx in October and never followed up. Gained about 10lbs back. Would like to restart and agrees to stay on longer. She had no side effects that she remembers. Her mood is much better. She is not taking trintellix. Once she separated from her husband she feels like a lot of things got better. She is staying more active. She has changed her diet up a lot.   Migraines are fairly well controlled and uses nurtec as needed for rescue.   .. Active Ambulatory Problems    Diagnosis Date Noted   Allergic rhinitis 02/16/2007   OLIGOMENORRHEA 02/16/2007   ACNE NEC 02/16/2007   LGSIL (low grade squamous intraepithelial dysplasia) 07/27/2008   Seasonal allergies 08/04/2015   Anxiety 08/04/2015   Multiple allergies 08/04/2015   Environmental allergies 08/18/2015   Class 3 severe obesity due to excess calories with serious comorbidity and body mass index (BMI) of 40.0 to 44.9 in adult [facility]) 10/23/2015   Need for rhogam due to Rh negative mother 11/19/2017   Encounter for smoking cessation counseling 04/03/2018   History of abnormal cervical Pap smear 04/19/2019   Hives 03/28/2020   Depression, recurrent ([place]) 03/28/2020   Worsening headaches 03/28/2020   Intractable migraine with aura with status migrainosus 03/28/2020   Trouble in sleeping 03/28/2020   Pap smear abnormality of cervix/human papillomavirus (HPV) positive 10/03/2020   NASH (nonalcoholic steatohepatitis) 12/04/2020   Pre-diabetes 10/12/2021   Resolved Ambulatory Problems    Diagnosis Date Noted   OBESITY NOS 02/16/2007   Headache(784.0) 07/25/2010   Right  knee pain 10/23/2015   Abnormal weight gain 10/23/2015   Past Medical History:  Diagnosis Date   Allergy    Vaginal Pap smear, abnormal      Review of Systems  All other systems reviewed and are negative.     Objective:     BP 118/68   Pulse 76   Ht 5\' 7"  (1.702 m)   Wt 256 lb (116.1 kg)   SpO2 100%   BMI 40.10 kg/m  BP Readings from Last 3 Encounters:  10/07/22 118/68  06/10/22 125/65  11/19/21 123/74   Wt Readings from Last 3 Encounters:  10/07/22 256 lb (116.1 kg)  06/10/22 269 lb (122 kg)  11/19/21 279 lb (126.6 kg)    ..    10/07/2022    1:40 PM 10/07/2022    1:16 PM 06/10/2022    9:32 AM 10/08/2021   10:05 AM 02/19/2021    2:34 PM  Depression screen PHQ 2/9  Decreased Interest 0 0 2 1 0  Down, Depressed, Hopeless 0 0 3 1 1   PHQ - 2 Score 0 0 5 2 1   Altered sleeping 1  3 2 2   Tired, decreased energy 0  2 2 1   Change in appetite 0  3 2 1   Feeling bad or failure about yourself  0  2 3 0  Trouble concentrating 1  2 1 1   Moving slowly or fidgety/restless 0  0 1 0  Suicidal thoughts 0  1 1 0  PHQ-9 Score 2  18  14 6  Difficult doing work/chores Not difficult at all  Very difficult Somewhat difficult Somewhat difficult   ..    10/07/2022    1:40 PM 06/10/2022    9:32 AM 10/08/2021   10:05 AM 02/19/2021    2:34 PM  GAD 7 : Generalized Anxiety Score  Nervous, Anxious, on Edge 1 3 1 1   Control/stop worrying 1 3 1  0  Worry too much - different things 1 2 1  0  Trouble relaxing 0 3 2 2   Restless 0 3 1 2   Easily annoyed or irritable 0 2 2 2   Afraid - awful might happen 0 0 2 0  Total GAD 7 Score 3 16 10 7   Anxiety Difficulty Not difficult at all Very difficult Somewhat difficult Somewhat difficult      Physical Exam Constitutional:      Appearance: Normal appearance. She is obese.  HENT:     Head: Normocephalic.  Cardiovascular:     Rate and Rhythm: Normal rate and regular rhythm.     Pulses: Normal pulses.     Heart sounds: Normal heart sounds.   Pulmonary:     Effort: Pulmonary effort is normal.     Breath sounds: Normal breath sounds.  Neurological:     General: No focal deficit present.     Mental Status: She is alert and oriented to person, place, and time.  Psychiatric:        Mood and Affect: Mood normal.         Assessment & Plan:  Marland KitchenMarland KitchenShylynn was seen today for follow-up.  Diagnoses and all orders for this visit:  Pre-diabetes -     COMPLETE METABOLIC PANEL WITH GFR -     Hemoglobin A1c -     tirzepatide (MOUNJARO) 5 MG/0.5ML Pen; Inject 5 mg into the skin once a week.  Class 3 severe obesity due to excess calories without serious comorbidity with body mass index (BMI) of 40.0 to 44.9 in adult (HCC) -     TSH  Lipid screening -     Lipid Panel w/reflex Direct LDL  Thyroid disorder screen -     TSH  Medication management -     TSH -     Lipid Panel w/reflex Direct LDL -     COMPLETE METABOLIC PANEL WITH GFR -     CBC with Differential/Platelet -     Hemoglobin A1c  Environmental allergies -     montelukast (SINGULAIR) 10 MG tablet; TAKE 1 TABLET BY MOUTH EVERYDAY AT BEDTIME  Multiple allergies -     montelukast (SINGULAIR) 10 MG tablet; TAKE 1 TABLET BY MOUTH EVERYDAY AT BEDTIME  NASH (nonalcoholic steatohepatitis) -     tirzepatide (MOUNJARO) 5 MG/0.5ML Pen; Inject 5 mg into the skin once a week.  Class 3 severe obesity due to excess calories with serious comorbidity and body mass index (BMI) of 40.0 to 44.9 in adult (HCC) -     tirzepatide (MOUNJARO) 5 MG/0.5ML Pen; Inject 5 mg into the skin once a week.  Intractable migraine with aura with status migrainosus -     Rimegepant Sulfate (NURTEC) 75 MG TBDP; Take 1 tablet (75 mg total) by mouth every other day.    Needs pap, declined today because on her period.  On OCP, no refills needed today.  Vitals look great.  Restart mounjaro at 5mg , can titrate as needed.  Continue diet and exercise changes.  Refilled nurtec for prn for migraine  rescue.  Screening  labs ordered.    Return in about 6 months (around 04/07/2023).    Tandy Gaw, PA-C

## 2022-10-08 ENCOUNTER — Encounter: Payer: Self-pay | Admitting: Physician Assistant

## 2022-10-18 NOTE — Telephone Encounter (Signed)
Forms completed, copy sent to scan, copy to hold. Given to front desk to collect payment.

## 2022-11-06 ENCOUNTER — Encounter: Payer: Self-pay | Admitting: Physician Assistant

## 2022-11-06 ENCOUNTER — Other Ambulatory Visit: Payer: Self-pay

## 2022-11-06 DIAGNOSIS — Z30011 Encounter for initial prescription of contraceptive pills: Secondary | ICD-10-CM

## 2022-11-06 MED ORDER — NORGESTIM-ETH ESTRAD TRIPHASIC 0.18/0.215/0.25 MG-25 MCG PO TABS: 1.0000 | ORAL_TABLET | Freq: Every day | ORAL | 1 refills | Status: DC

## 2022-11-20 ENCOUNTER — Ambulatory Visit: Payer: 59 | Admitting: Physician Assistant

## 2022-11-20 VITALS — BP 134/67 | HR 86 | Ht 67.0 in | Wt 259.0 lb

## 2022-11-20 DIAGNOSIS — N6313 Unspecified lump in the right breast, lower outer quadrant: Secondary | ICD-10-CM | POA: Diagnosis not present

## 2022-11-20 DIAGNOSIS — N644 Mastodynia: Secondary | ICD-10-CM | POA: Diagnosis not present

## 2022-11-20 NOTE — Progress Notes (Unsigned)
Acute Office Visit  Subjective:     Patient ID: Shelby Cook, female    DOB: [birth year], 37 y.o.   MRN: [id number]  Chief Complaint  Patient presents with   Breast Pain    HPI Patient is in today for bilateral breast pain right worse than left.  She started a new birth control in January.  January and February she had no significant symptoms or problems.  She had her last.  At the end of February and started her new birth control patch.  She noticed severe breast pain after she started her new pack.  She palpated a large tender mass of her right outer quadrant breast that was worrisome.  Since she made the appointment the mass has decreased in size.  Her tenderness also has improved some.  She denies any nipple discharge, fever, redness.  She has not tried anything to make better.  She has been on birth control pills in the past and it has not gated excessive tenderness like this. .. Active Ambulatory Problems    Diagnosis Date Noted   Allergic rhinitis 02/16/2007   OLIGOMENORRHEA 02/16/2007   ACNE NEC 02/16/2007   LGSIL (low grade squamous intraepithelial dysplasia) 07/27/2008   Seasonal allergies 08/04/2015   Anxiety 08/04/2015   Multiple allergies 08/04/2015   Environmental allergies 08/18/2015   Class 3 severe obesity due to excess calories with serious comorbidity and body mass index (BMI) of 40.0 to 44.9 in adult Shelby Cook) 10/23/2015   Need for rhogam due to Rh negative mother 11/19/2017   Encounter for smoking cessation counseling 04/03/2018   History of abnormal cervical Pap smear 04/19/2019   Hives 03/28/2020   Depression, recurrent ([place]) 03/28/2020   Worsening headaches 03/28/2020   Intractable migraine with aura with status migrainosus 03/28/2020   Trouble in sleeping 03/28/2020   Pap smear abnormality of cervix/human papillomavirus (HPV) positive 10/03/2020   NASH (nonalcoholic steatohepatitis) 12/04/2020   Pre-diabetes 10/12/2021   Mass of lower outer quadrant of  right breast 11/20/2022   Breast tenderness in female 11/20/2022   Resolved Ambulatory Problems    Diagnosis Date Noted   OBESITY NOS 02/16/2007   Headache(784.0) 07/25/2010   Right knee pain 10/23/2015   Abnormal weight gain 10/23/2015   Past Medical History:  Diagnosis Date   Allergy    Vaginal Pap smear, abnormal     ROS See HPI.     Objective:    BP 134/67   Pulse 86   Ht '5\' 7"'$  (1.702 m)   Wt 259 lb (117.5 kg)   SpO2 100%   BMI 40.57 kg/m  BP Readings from Last 3 Encounters:  11/20/22 134/67  10/07/22 118/68  06/10/22 125/65   Wt Readings from Last 3 Encounters:  11/20/22 259 lb (117.5 kg)  10/07/22 256 lb (116.1 kg)  06/10/22 269 lb (122 kg)      Physical Exam Constitutional:      Appearance: Normal appearance. She is obese.  HENT:     Head: Normocephalic.  Cardiovascular:     Rate and Rhythm: Normal rate and regular rhythm.  Pulmonary:     Effort: Pulmonary effort is normal.     Comments: Bilateral breast tenderness to palpation right worse than left Very small tender and mobile mass of the right lower outer quadrant about walnut size No redness or warmth No changes in nipples Neurological:     Mental Status: She is alert.          Assessment & Plan:  Marland KitchenMarland Kitchen  Shelby Cook was seen today for breast pain.  Diagnoses and all orders for this visit:  Breast tenderness in female -     Cancel: US BREAST COMPLETE UNI RIGHT INC AXILLA; Future -     US BREAST COMPLETE UNI RIGHT INC AXILLA  Mass of lower outer quadrant of right breast -     Cancel: US BREAST COMPLETE UNI RIGHT INC AXILLA; Future -     US BREAST COMPLETE UNI RIGHT INC AXILLA   Certainly suspect breast tenderness is somewhat related to her oral contraception use and hormones. Discussed Cyclical pain is as a normal hormone side effect.   It is likely that the mass that seems to be decreasing in size was a cyst. Discussed things that make breast pain and cyst more likely.  For now would like  for her to continue her birth control and we will get an ultrasound of her breast to evaluate the mass and tenderness.  She did have 2 months where she tolerated the birth control very well.   Iran Planas, PA-C

## 2022-11-20 NOTE — Patient Instructions (Signed)
Stay on OCP Get breast ultrasound

## 2022-11-21 ENCOUNTER — Encounter: Payer: Self-pay | Admitting: Physician Assistant

## 2022-12-04 ENCOUNTER — Other Ambulatory Visit: Payer: Self-pay | Admitting: Physician Assistant

## 2022-12-04 DIAGNOSIS — N6313 Unspecified lump in the right breast, lower outer quadrant: Secondary | ICD-10-CM

## 2022-12-04 DIAGNOSIS — N644 Mastodynia: Secondary | ICD-10-CM

## 2022-12-23 ENCOUNTER — Other Ambulatory Visit: Payer: Self-pay | Admitting: Physician Assistant

## 2022-12-23 ENCOUNTER — Ambulatory Visit
Admission: RE | Admit: 2022-12-23 | Discharge: 2022-12-23 | Disposition: A | Discharge status: Home / Self Care | Payer: 59 | Source: Ambulatory Visit | Attending: Physician Assistant | Admitting: Physician Assistant

## 2022-12-23 DIAGNOSIS — N644 Mastodynia: Secondary | ICD-10-CM

## 2022-12-23 DIAGNOSIS — N6313 Unspecified lump in the right breast, lower outer quadrant: Secondary | ICD-10-CM

## 2022-12-23 NOTE — Progress Notes (Signed)
GREAT news from breast imaging. Fibroglandular tissue. Short term follow up of 6 months recommended.

## 2023-01-10 ENCOUNTER — Other Ambulatory Visit: Payer: Self-pay | Admitting: Physician Assistant

## 2023-01-10 DIAGNOSIS — N6320 Unspecified lump in the left breast, unspecified quadrant: Secondary | ICD-10-CM

## 2023-02-19 ENCOUNTER — Encounter: Payer: Self-pay | Admitting: Physician Assistant

## 2023-02-19 ENCOUNTER — Telehealth: Payer: Self-pay

## 2023-02-19 NOTE — Telephone Encounter (Addendum)
Initiated Prior authorization ZOX:WRUEAV 75MG  dispersible tablets  Via: Covermymeds Case/Key:[id number] Status: approved  as of 02/19/23 Reason:Authorization Expiration Date: 02/19/2024 Notified Pt via: Mychart

## 2023-02-21 ENCOUNTER — Encounter: Payer: Self-pay | Admitting: Physician Assistant

## 2023-02-21 DIAGNOSIS — Z30011 Encounter for initial prescription of contraceptive pills: Secondary | ICD-10-CM

## 2023-02-24 MED ORDER — NORGESTIM-ETH ESTRAD TRIPHASIC 0.18/0.215/0.25 MG-25 MCG PO TABS: 1.0000 | ORAL_TABLET | Freq: Every day | ORAL | 1 refills | Status: DC

## 2023-02-24 MED ORDER — CETIRIZINE HCL 5 MG PO TABS: 5.0000 mg | ORAL_TABLET | Freq: Every day | ORAL | 3 refills | Status: AC

## 2023-02-26 ENCOUNTER — Other Ambulatory Visit: Payer: Self-pay

## 2023-02-26 DIAGNOSIS — Z9109 Other allergy status, other than to drugs and biological substances: Secondary | ICD-10-CM

## 2023-02-26 DIAGNOSIS — Z889 Allergy status to unspecified drugs, medicaments and biological substances status: Secondary | ICD-10-CM

## 2023-02-26 MED ORDER — MONTELUKAST SODIUM 10 MG PO TABS: ORAL_TABLET | ORAL | 1 refills | Status: DC

## 2023-04-07 ENCOUNTER — Ambulatory Visit: Payer: 59 | Admitting: Physician Assistant

## 2023-04-07 ENCOUNTER — Encounter: Payer: Self-pay | Admitting: Physician Assistant

## 2023-04-07 DIAGNOSIS — K7581 Nonalcoholic steatohepatitis (NASH): Secondary | ICD-10-CM | POA: Diagnosis not present

## 2023-04-07 DIAGNOSIS — G43111 Migraine with aura, intractable, with status migrainosus: Secondary | ICD-10-CM

## 2023-04-07 DIAGNOSIS — R7303 Prediabetes: Secondary | ICD-10-CM | POA: Diagnosis not present

## 2023-04-07 DIAGNOSIS — Z6841 Body Mass Index (BMI) 40.0 and over, adult: Secondary | ICD-10-CM

## 2023-04-07 MED ORDER — NURTEC 75 MG PO TBDP
1.0000 | ORAL_TABLET | ORAL | 5 refills | Status: DC
Start: 2023-04-07 — End: 2023-04-16

## 2023-04-07 MED ORDER — TIRZEPATIDE 5 MG/0.5ML ~~LOC~~ SOAJ
5.0000 mg | SUBCUTANEOUS | 1 refills | Status: DC
Start: 2023-04-07 — End: 2024-02-16

## 2023-04-07 NOTE — Progress Notes (Signed)
Established Patient Office Visit  Subjective   Patient ID: Shelby Cook, female    DOB: 1986/08/18  Age: 37 y.o. MRN: [id number]  Chief Complaint  Patient presents with   Follow-up    6 month follow up-  wanting to continue on mounjaro 5mg  ( does nto want to increase strength) - patient states has not been able to get Nurtec prior authorization has been in process for one month    HPI Pt is a 37 yo obese female with pre-diabetes, migraines, NASH, MDD and anxiety who presents to the clinic for 6 month follow up.   Pt is doing ok. Her mood is controlled. She continues to lose weight. She has lost 45lbs on mounjaro. She does not want to increase dose at this time. She is not checking her sugars. She is walking daily and trying to get to gym more. She is watching her portion sizes. She is in a good relationship.   She did not get her labs drawn.   Migraines increased in frequency. Nurtec approved but waiting for it to process.    .. Active Ambulatory Problems    Diagnosis Date Noted   Allergic rhinitis 02/16/2007   OLIGOMENORRHEA 02/16/2007   ACNE NEC 02/16/2007   LGSIL (low grade squamous intraepithelial dysplasia) 07/27/2008   Seasonal allergies 08/04/2015   Anxiety 08/04/2015   Multiple allergies 08/04/2015   Environmental allergies 08/18/2015   Class 3 severe obesity due to excess calories with serious comorbidity and body mass index (BMI) of 40.0 to 44.9 in adult [facility]) 10/23/2015   Need for rhogam due to Rh negative mother 11/19/2017   Encounter for smoking cessation counseling 04/03/2018   History of abnormal cervical Pap smear 04/19/2019   Hives 03/28/2020   Depression, recurrent (HCC) 03/28/2020   Worsening headaches 03/28/2020   Intractable migraine with aura with status migrainosus 03/28/2020   Trouble in sleeping 03/28/2020   Pap smear abnormality of cervix/human papillomavirus (HPV) positive 10/03/2020   NASH (nonalcoholic steatohepatitis) 12/04/2020    Pre-diabetes 10/12/2021   Mass of lower outer quadrant of right breast 11/20/2022   Breast tenderness in female 11/20/2022   Resolved Ambulatory Problems    Diagnosis Date Noted   OBESITY NOS 02/16/2007   Headache(784.0) 07/25/2010   Right knee pain 10/23/2015   Abnormal weight gain 10/23/2015   Past Medical History:  Diagnosis Date   Allergy    Vaginal Pap smear, abnormal      ROS Increase in migraines   Objective:     BP (!) 118/59   Pulse 95   Ht 5\' 7"  (1.702 m)   Wt 253 lb (114.8 kg)   SpO2 98%   BMI 39.63 kg/m  BP Readings from Last 3 Encounters:  04/07/23 (!) 118/59  11/20/22 134/67  10/07/22 118/68   Wt Readings from Last 3 Encounters:  04/07/23 253 lb (114.8 kg)  11/20/22 259 lb (117.5 kg)  10/07/22 256 lb (116.1 kg)     Physical Exam Constitutional:      Appearance: Normal appearance. She is obese.  HENT:     Head: Normocephalic.  Cardiovascular:     Rate and Rhythm: Normal rate and regular rhythm.  Pulmonary:     Effort: Pulmonary effort is normal.     Breath sounds: Normal breath sounds.  Musculoskeletal:     Right lower leg: No edema.     Left lower leg: No edema.  Neurological:     General: No focal deficit present.  Mental Status: She is alert.  Psychiatric:        Mood and Affect: Mood normal.         Assessment & Plan:  Marland KitchenMarland KitchenJomayra was seen today for follow-up.  Diagnoses and all orders for this visit:  Pre-diabetes -     tirzepatide (MOUNJARO) 5 MG/0.5ML Pen; Inject 5 mg into the skin once a week. -     Hemoglobin A1c -     Lipid panel -     CMP and Liver -     TSH -     CBC w/Diff/Platelet  NASH (nonalcoholic steatohepatitis) -     tirzepatide (MOUNJARO) 5 MG/0.5ML Pen; Inject 5 mg into the skin once a week. -     Hemoglobin A1c -     Lipid panel -     CMP and Liver -     TSH -     CBC w/Diff/Platelet  Class 3 severe obesity due to excess calories with serious comorbidity and body mass index (BMI) of 40.0 to  44.9 in adult (HCC) -     tirzepatide (MOUNJARO) 5 MG/0.5ML Pen; Inject 5 mg into the skin once a week. -     Hemoglobin A1c -     Lipid panel -     CMP and Liver -     TSH -     CBC w/Diff/Platelet  Intractable migraine with aura with status migrainosus -     Rimegepant Sulfate (NURTEC) 75 MG TBDP; Take 1 tablet (75 mg total) by mouth every other day.   Get labs to follow up today Mounjaro refilled for 6 months Down 45lbs from starting weight Nurtec refilled for migraine prevention with as needed maxalt     Return in about 6 months (around 10/08/2023).    Tandy Gaw, PA-C

## 2023-04-08 ENCOUNTER — Telehealth: Payer: Self-pay | Admitting: Physician Assistant

## 2023-04-08 LAB — CBC WITH DIFFERENTIAL/PLATELET
Basophils Absolute: 0.1 10*3/uL (ref 0.0–0.2)
Basos: 1 %
EOS (ABSOLUTE): 0.6 10*3/uL — ABNORMAL HIGH (ref 0.0–0.4)
Eos: 4 %
Hematocrit: 45 % (ref 34.0–46.6)
Hemoglobin: 13.7 g/dL (ref 11.1–15.9)
Immature Granulocytes: 0 %
Lymphocytes Absolute: 2.9 10*3/uL (ref 0.7–3.1)
Lymphs: 21 %
MCH: 31 pg (ref 26.6–33.0)
MCHC: 30.4 g/dL — ABNORMAL LOW (ref 31.5–35.7)
MCV: 102 fL — ABNORMAL HIGH (ref 79–97)
Monocytes Absolute: 0.6 10*3/uL (ref 0.1–0.9)
Monocytes: 4 %
Neutrophils Absolute: 9.8 10*3/uL — ABNORMAL HIGH (ref 1.4–7.0)
Neutrophils: 70 %
Platelets: 412 10*3/uL (ref 150–450)
RBC: 4.42 x10E6/uL (ref 3.77–5.28)
RDW: 13.6 % (ref 11.7–15.4)
WBC: 13.9 10*3/uL — ABNORMAL HIGH (ref 3.4–10.8)

## 2023-04-08 NOTE — Telephone Encounter (Signed)
Barbara Cower with [employer] called. He is checking on status of Appeal.

## 2023-04-09 ENCOUNTER — Encounter: Payer: Self-pay | Admitting: Physician Assistant

## 2023-04-09 LAB — LIPID PANEL
Chol/HDL Ratio: 3.1 ratio (ref 0.0–4.4)
Cholesterol, Total: 177 mg/dL (ref 100–199)
HDL: 57 mg/dL (ref >39)
LDL Chol Calc (NIH): 100 mg/dL — ABNORMAL HIGH (ref 0–99)
Triglycerides: 112 mg/dL (ref 0–149)
VLDL Cholesterol Cal: 20 mg/dL (ref 5–40)

## 2023-04-09 LAB — CMP AND LIVER
ALT: 14 IU/L (ref 0–32)
AST: 18 IU/L (ref 0–40)
Albumin: 4 g/dL (ref 3.9–4.9)
Alkaline Phosphatase: 76 IU/L (ref 44–121)
BUN: 9 mg/dL (ref 6–20)
Bilirubin Total: 0.2 mg/dL (ref 0.0–1.2)
Bilirubin, Direct: <0.1 mg/dL (ref 0.00–0.40)
CO2: 22 mmol/L (ref 20–29)
Calcium: 9.2 mg/dL (ref 8.7–10.2)
Chloride: 102 mmol/L (ref 96–106)
Creatinine, Ser: 0.71 mg/dL (ref 0.57–1.00)
Glucose: 98 mg/dL (ref 70–99)
Potassium: 4.2 mmol/L (ref 3.5–5.2)
Sodium: 139 mmol/L (ref 134–144)
Total Protein: 6.6 g/dL (ref 6.0–8.5)
eGFR: 114 mL/min/{1.73_m2} (ref >59)

## 2023-04-09 LAB — TSH: TSH: 1.14 u[IU]/mL (ref 0.450–4.500)

## 2023-04-09 LAB — HEMOGLOBIN A1C

## 2023-04-09 NOTE — Telephone Encounter (Signed)
Unsure what this communication is about  theres no follow up number  to call back

## 2023-04-11 ENCOUNTER — Encounter: Payer: Self-pay | Admitting: Physician Assistant

## 2023-04-11 NOTE — Progress Notes (Signed)
Your WBC is elevated.  Have you had any prednisone or steroids recently.  It can be due to virus or bacterial infection.  How are you feeling?   Thyroid looks great.  Kidney and liver look great.  Cholesterol looks good.

## 2023-04-14 NOTE — Progress Notes (Signed)
A1C did result. Pre-diabetes down just a hair from 5.9.

## 2023-04-16 ENCOUNTER — Other Ambulatory Visit: Payer: Self-pay | Admitting: Physician Assistant

## 2023-04-16 DIAGNOSIS — G43111 Migraine with aura, intractable, with status migrainosus: Secondary | ICD-10-CM

## 2023-04-16 MED ORDER — NURTEC 75 MG PO TBDP
1.0000 | ORAL_TABLET | ORAL | 5 refills | Status: DC | PRN
Start: 2023-04-16 — End: 2024-02-16

## 2023-05-29 ENCOUNTER — Telehealth: Payer: Self-pay | Admitting: Physician Assistant

## 2023-05-29 NOTE — Telephone Encounter (Signed)
Shelby Cook from [facility] pharmac[facility] called to f/u on a appeal for Rimegepant Sulfate (NURTEC) 75 MG TBDP [[id number]] .  [phone]

## 2023-07-07 ENCOUNTER — Other Ambulatory Visit: Payer: Self-pay | Admitting: Physician Assistant

## 2023-07-07 DIAGNOSIS — Z30011 Encounter for initial prescription of contraceptive pills: Secondary | ICD-10-CM

## 2023-07-12 ENCOUNTER — Other Ambulatory Visit: Payer: Self-pay | Admitting: Physician Assistant

## 2023-07-12 DIAGNOSIS — Z889 Allergy status to unspecified drugs, medicaments and biological substances status: Secondary | ICD-10-CM

## 2023-07-12 DIAGNOSIS — Z9109 Other allergy status, other than to drugs and biological substances: Secondary | ICD-10-CM

## 2023-10-13 ENCOUNTER — Ambulatory Visit: Payer: 59 | Admitting: Physician Assistant

## 2023-10-16 ENCOUNTER — Encounter: Payer: Self-pay | Admitting: Physician Assistant

## 2023-10-20 ENCOUNTER — Encounter: Payer: 59 | Admitting: Physician Assistant

## 2023-11-07 ENCOUNTER — Encounter: Payer: 59 | Admitting: Physician Assistant

## 2024-01-05 ENCOUNTER — Other Ambulatory Visit: Payer: Self-pay | Admitting: Physician Assistant

## 2024-01-05 DIAGNOSIS — N6320 Unspecified lump in the left breast, unspecified quadrant: Secondary | ICD-10-CM

## 2024-01-22 ENCOUNTER — Telehealth: Payer: Self-pay

## 2024-01-22 NOTE — Telephone Encounter (Signed)
 Pharmacy Patient Advocate Encounter   Received notification from CoverMyMeds that prior authorization for Nurtec 75MG  dispersible tablets is due for renewal.   Insurance verification completed.   The patient is insured through [facility]  Action: Patient hasn't been seen recently in your office. Plan requires updated chart notes for PA renewal.     Archived key: [id number]

## 2024-01-22 NOTE — Telephone Encounter (Signed)
 Please review note from the Rx auth team regarding the PA outcome.

## 2024-01-26 NOTE — Telephone Encounter (Signed)
 Task completed. Patient agreeable with plan. Transferred to the front office for scheduling an appointment.

## 2024-02-16 ENCOUNTER — Ambulatory Visit (INDEPENDENT_AMBULATORY_CARE_PROVIDER_SITE_OTHER): Admitting: Physician Assistant

## 2024-02-16 ENCOUNTER — Other Ambulatory Visit (HOSPITAL_COMMUNITY)
Admission: RE | Admit: 2024-02-16 | Discharge: 2024-02-16 | Disposition: A | Discharge status: Home / Self Care | Source: Ambulatory Visit | Attending: Physician Assistant | Admitting: Physician Assistant

## 2024-02-16 ENCOUNTER — Encounter: Payer: Self-pay | Admitting: Physician Assistant

## 2024-02-16 ENCOUNTER — Ambulatory Visit

## 2024-02-16 VITALS — BP 123/73 | HR 87 | Temp 98.0°F | Ht 67.0 in | Wt 263.0 lb

## 2024-02-16 DIAGNOSIS — Z3041 Encounter for surveillance of contraceptive pills: Secondary | ICD-10-CM | POA: Diagnosis not present

## 2024-02-16 DIAGNOSIS — R1031 Right lower quadrant pain: Secondary | ICD-10-CM | POA: Diagnosis not present

## 2024-02-16 DIAGNOSIS — Z6841 Body Mass Index (BMI) 40.0 and over, adult: Secondary | ICD-10-CM

## 2024-02-16 DIAGNOSIS — Z0000 Encounter for general adult medical examination without abnormal findings: Secondary | ICD-10-CM

## 2024-02-16 DIAGNOSIS — G43111 Migraine with aura, intractable, with status migrainosus: Secondary | ICD-10-CM

## 2024-02-16 DIAGNOSIS — Z30011 Encounter for initial prescription of contraceptive pills: Secondary | ICD-10-CM

## 2024-02-16 DIAGNOSIS — R198 Other specified symptoms and signs involving the digestive system and abdomen: Secondary | ICD-10-CM

## 2024-02-16 DIAGNOSIS — Z124 Encounter for screening for malignant neoplasm of cervix: Secondary | ICD-10-CM | POA: Diagnosis not present

## 2024-02-16 DIAGNOSIS — E66813 Obesity, class 3: Secondary | ICD-10-CM

## 2024-02-16 MED ORDER — NURTEC 75 MG PO TBDP: 1.0000 | ORAL_TABLET | ORAL | 5 refills | Status: AC | PRN

## 2024-02-16 MED ORDER — NALTREXONE-BUPROPION HCL ER 8-90 MG PO TB12
2.0000 | ORAL_TABLET | Freq: Two times a day (BID) | ORAL | 5 refills | Status: DC
Start: 2024-03-17 — End: 2024-07-23

## 2024-02-16 MED ORDER — NORGESTIMATE-ETH ESTRADIOL 0.18/0.215/0.25 MG-25 MCG PO TABS: 1.0000 | ORAL_TABLET | Freq: Every day | ORAL | 3 refills | Status: AC

## 2024-02-16 MED ORDER — NALTREXONE-BUPROPION HCL ER 8-90 MG PO TB12: ORAL_TABLET | ORAL | 0 refills | Status: DC

## 2024-02-16 NOTE — Progress Notes (Signed)
 Complete physical exam  Patient: Shelby Cook   DOB: [birth year]   38 y.o. Female  MRN: [id number]  Subjective:     Chief Complaint  Patient presents with   Annual Exam    Shelby Cook is a 38 y.o. female who presents today for a complete physical exam. She reports consuming a general diet. She is walking her dogs at least twice a day.  She generally feels well. She reports sleeping well most of the time. If she has a night where she cannot sleep she uses unisom. She does not have additional problems to discuss today.    Most recent fall risk assessment:    02/17/2024    7:14 AM  Fall Risk   Falls in the past year? 0  Number falls in past yr: 0  Injury with Fall? 0  Risk for fall due to : No Fall Risks  Follow up Falls evaluation completed     Most recent depression screenings:    02/17/2024    7:14 AM 11/20/2022    1:43 PM  PHQ 2/9 Scores  PHQ - 2 Score 0 1    Vision:Within last year, Dental: No current dental problems and Receives regular dental care, and STD: The patient denies history of sexually transmitted disease.  Patient Active Problem List   Diagnosis Date Noted   Class 3 severe obesity due to excess calories without serious comorbidity with body mass index (BMI) of 40.0 to 44.9 in adult 02/17/2024   Abdominal guarding 02/16/2024   Mass of lower outer quadrant of right breast 11/20/2022   Breast tenderness in female 11/20/2022   Pre-diabetes 10/12/2021   NASH (nonalcoholic steatohepatitis) 12/04/2020   Pap smear abnormality of cervix/human papillomavirus (HPV) positive 10/03/2020   Hives 03/28/2020   Depression, recurrent (HCC) 03/28/2020   Worsening headaches 03/28/2020   Intractable migraine with aura with status migrainosus 03/28/2020   Trouble in sleeping 03/28/2020   History of abnormal cervical Pap smear 04/19/2019   Encounter for smoking cessation counseling 04/03/2018   Need for rhogam due to Rh negative mother 11/19/2017   Class 3  severe obesity due to excess calories with serious comorbidity and body mass index (BMI) of 40.0 to 44.9 in adult 10/23/2015   Environmental allergies 08/18/2015   Seasonal allergies 08/04/2015   Anxiety 08/04/2015   Multiple allergies 08/04/2015   LGSIL (low grade squamous intraepithelial dysplasia) 07/27/2008   Allergic rhinitis 02/16/2007   OLIGOMENORRHEA 02/16/2007   ACNE NEC 02/16/2007   Past Medical History:  Diagnosis Date   Allergy    Vaginal Pap smear, abnormal    Past Surgical History:  Procedure Laterality Date   WISDOM TOOTH EXTRACTION     Family History  Problem Relation Age of Onset   Osteoporosis Mother    Drug abuse Father    Mental illness Father        bipolar   Diabetes Father    Hypertension Father    Heart attack Father    Scoliosis Sister    Allergies Sister    Breast cancer Paternal Grandmother    Cancer Paternal Grandmother        breast and lung   Diabetes Paternal Grandfather    Heart attack Paternal Grandfather    Allergies  Allergen Reactions   Celexa  [Citalopram  Hydrobromide] Hives   Flonase [Fluticasone Propionate]     Nose bleeds       Patient Care Team: Rowyn Spilde L, PA-C as PCP - General (Family Medicine)  Outpatient Medications Prior to Visit  Medication Sig   cetirizine  (ZYRTEC ) 5 MG tablet Take 1 tablet (5 mg total) by mouth daily. Take 2 in am  & 1 in pm   diphenhydrAMINE (BENADRYL) 25 MG tablet Take 25 mg by mouth at bedtime.   montelukast  (SINGULAIR ) 10 MG tablet TAKE 1 TABLET BY MOUTH EVERY  NIGHT AT BEDTIME   [DISCONTINUED] Rimegepant Sulfate (NURTEC) 75 MG TBDP Take 1 tablet (75 mg total) by mouth as needed.   [DISCONTINUED] TRI-LO-ESTARYLLA 0.18/0.215/0.25 MG-25 MCG tab TAKE 1 TABLET BY MOUTH DAILY   [DISCONTINUED] tirzepatide  (MOUNJARO ) 5 MG/0.5ML Pen Inject 5 mg into the skin once a week.   No facility-administered medications prior to visit.    Review of Systems  All other systems reviewed and are  negative.         Objective:     BP 123/73   Pulse 87   Temp 98 F (36.7 C) (Oral)   Ht 5\' 7"  (1.702 m)   Wt 263 lb (119.3 kg)   SpO2 99%   BMI 41.19 kg/m  BP Readings from Last 3 Encounters:  02/16/24 123/73  04/07/23 (!) 118/59  11/20/22 134/67   Wt Readings from Last 3 Encounters:  02/16/24 263 lb (119.3 kg)  04/07/23 253 lb (114.8 kg)  11/20/22 259 lb (117.5 kg)      Physical Exam  BP 123/73   Pulse 87   Temp 98 F (36.7 C) (Oral)   Ht 5\' 7"  (1.702 m)   Wt 263 lb (119.3 kg)   SpO2 99%   BMI 41.19 kg/m   General Appearance:    Alert, cooperative, obese, no distress, appears stated age  Head:    Normocephalic, without obvious abnormality, atraumatic  Eyes:    PERRL, conjunctiva/corneas clear, EOM's intact, fundi    benign, both eyes  Ears:    Normal TM's and external ear canals, both ears  Nose:   Nares normal, septum midline, mucosa normal, no drainage    or sinus tenderness  Throat:   Lips, mucosa, and tongue normal; teeth and gums normal  Neck:   Supple, symmetrical, trachea midline, no adenopathy;    thyroid:  no enlargement/tenderness/nodules; no carotid   bruit or JVD  Back:     Symmetric, no curvature, ROM normal, no CVA tenderness  Lungs:     Clear to auscultation bilaterally, respirations unlabored  Chest Wall:    No tenderness or deformity   Heart:    Regular rate and rhythm, S1 and S2 normal, no murmur, rub   or gallop  Breast Exam:    No tenderness, masses, or nipple abnormality  Abdomen:     Soft, non-tender, right lower quadrant guarding on exam today   no masses, no organomegaly  Genitalia:    Normal female without lesion, discharge or tenderness  Rectal:    Normal tone, no masses or tenderness;    Extremities:   Extremities normal, atraumatic, no cyanosis or edema  Pulses:   2+ and symmetric all extremities  Skin:   Skin color, texture, turgor normal, no rashes or lesions  Lymph nodes:   Cervical, supraclavicular, and axillary nodes  normal  Neurologic:   CNII-XII intact, normal strength, sensation and reflexes    throughout      Assessment & Plan:    Routine Health Maintenance and Physical Exam  Immunization History  Administered Date(s) Administered   Influenza Whole 06/16/2008, 07/25/2010   Influenza,inj,Quad PF,6+ Mos 08/04/2015, 07/28/2017, 07/15/2019, 08/28/2020, 06/10/2022  Influenza-Unspecified 06/13/2018   PFIZER(Purple Top)SARS-COV-2 Vaccination 10/04/2019, 10/25/2019   Td 09/17/1999, 07/25/2010, 05/25/2014   Td (Adult),5 Lf Tetanus Toxid, Preservative Free 05/25/2014    Health Maintenance  Topic Date Due   Cervical Cancer Screening (Pap smear)  10/08/2022   COVID-19 Vaccine (3 - Pfizer risk series) 03/03/2024 (Originally 11/22/2019)   INFLUENZA VACCINE  04/16/2024   DTaP/Tdap/Td (5 - Tdap) 05/25/2024   Hepatitis C Screening  Completed   HIV Screening  Completed   HPV VACCINES  Aged Out   Meningococcal B Vaccine  Aged Out    Discussed health benefits of physical activity, and encouraged her to engage in regular exercise appropriate for her age and condition.  Aaron AasDiego Foy was seen today for annual exam.  Diagnoses and all orders for this visit:  Annual physical exam -     CBC with Differential/Platelet -     CMP14+EGFR -     Lipid panel -     TSH -     VITAMIN D  25 Hydroxy (Vit-D Deficiency, Fractures)  Encounter for surveillance of contraceptive pills -     Norgestimate-Eth Estradiol (TRI-LO-ESTARYLLA) 0.18/0.215/0.25 MG-25 MCG TABS; Take 1 tablet by mouth daily.  Intractable migraine with aura with status migrainosus -     Rimegepant Sulfate (NURTEC) 75 MG TBDP; Take 1 tablet (75 mg total) by mouth as needed.  Routine Papanicolaou smear -     Cytology - PAP  Abdominal guarding -     US  Pelvic Complete With Transvaginal; Future  Class 3 severe obesity due to excess calories without serious comorbidity with body mass index (BMI) of 40.0 to 44.9 in adult -     Naltrexone-buPROPion   HCl ER 8-90 MG TB12; 1 tab daily for week 1, then 1 tab BID for week 2, then 2 tab PO qAM and 1 tab PO qPM for week 3, then 2 tabs BID. -     Naltrexone-buPROPion  HCl ER 8-90 MG TB12; Take 2 tablets by mouth 2 (two) times daily.   .. Discussed 150 minutes of exercise a week.  Encouraged vitamin D  1000 units and Calcium 1300mg  or 4 servings of dairy a day.  PHQ no concerns, pt is really happy. Fasting labs ordered today Pap done today, OCP repeated Pt found to have right lower quadrant guarding with no other complaints today and only found on exam. No masses palpated.  U/S ordered to evaluate. STD testing added to pap.  Vitals look great  .Aaron AasDiscussed low carb diet with 1500 calories and 80g of protein.  Exercising at least 150 minutes a week.  [employer] could be a Chief Technology Officer.  Insurance would not pay for GLPs once she got her A1C out of pre-diabetes range Start contrave Discussed how to take and side effects Follow up in 3 months  Migraines 3-6 HA days a month On nurtec as needed for rescue FMLA filled out today for work      [employer], PA-C

## 2024-02-16 NOTE — Patient Instructions (Signed)

## 2024-02-17 ENCOUNTER — Ambulatory Visit: Payer: Self-pay | Admitting: Physician Assistant

## 2024-02-17 ENCOUNTER — Encounter: Payer: Self-pay | Admitting: Physician Assistant

## 2024-02-17 DIAGNOSIS — R87612 Low grade squamous intraepithelial lesion on cytologic smear of cervix (LGSIL): Secondary | ICD-10-CM

## 2024-02-17 DIAGNOSIS — Z6841 Body Mass Index (BMI) 40.0 and over, adult: Secondary | ICD-10-CM | POA: Insufficient documentation

## 2024-02-17 LAB — CBC WITH DIFFERENTIAL/PLATELET
Basophils Absolute: 0.1 10*3/uL (ref 0.0–0.2)
Basos: 0 %
EOS (ABSOLUTE): 0.5 10*3/uL — ABNORMAL HIGH (ref 0.0–0.4)
Eos: 3 %
Hematocrit: 43.7 % (ref 34.0–46.6)
Hemoglobin: 14.6 g/dL (ref 11.1–15.9)
Immature Grans (Abs): 0 10*3/uL (ref 0.0–0.1)
Immature Granulocytes: 0 %
Lymphocytes Absolute: 2.7 10*3/uL (ref 0.7–3.1)
Lymphs: 19 %
MCH: 31.8 pg (ref 26.6–33.0)
MCHC: 33.4 g/dL (ref 31.5–35.7)
MCV: 95 fL (ref 79–97)
Monocytes Absolute: 0.7 10*3/uL (ref 0.1–0.9)
Monocytes: 5 %
Neutrophils Absolute: 10.7 10*3/uL — ABNORMAL HIGH (ref 1.4–7.0)
Neutrophils: 73 %
Platelets: 426 10*3/uL (ref 150–450)
RBC: 4.59 x10E6/uL (ref 3.77–5.28)
RDW: 12.5 % (ref 11.7–15.4)
WBC: 14.6 10*3/uL — ABNORMAL HIGH (ref 3.4–10.8)

## 2024-02-17 LAB — CMP14+EGFR
ALT: 13 IU/L (ref 0–32)
AST: 18 IU/L (ref 0–40)
Albumin: 3.8 g/dL — ABNORMAL LOW (ref 3.9–4.9)
Alkaline Phosphatase: 91 IU/L (ref 44–121)
BUN/Creatinine Ratio: 9 (ref 9–23)
BUN: 7 mg/dL (ref 6–20)
Bilirubin Total: 0.3 mg/dL (ref 0.0–1.2)
CO2: 19 mmol/L — ABNORMAL LOW (ref 20–29)
Calcium: 9.5 mg/dL (ref 8.7–10.2)
Chloride: 102 mmol/L (ref 96–106)
Creatinine, Ser: 0.82 mg/dL (ref 0.57–1.00)
Globulin, Total: 3.1 g/dL (ref 1.5–4.5)
Glucose: 129 mg/dL — ABNORMAL HIGH (ref 70–99)
Potassium: 4.6 mmol/L (ref 3.5–5.2)
Sodium: 136 mmol/L (ref 134–144)
Total Protein: 6.9 g/dL (ref 6.0–8.5)
eGFR: 95 mL/min/{1.73_m2} (ref >59)

## 2024-02-17 LAB — LIPID PANEL
Chol/HDL Ratio: 3.2 ratio (ref 0.0–4.4)
Cholesterol, Total: 188 mg/dL (ref 100–199)
HDL: 59 mg/dL (ref >39)
LDL Chol Calc (NIH): 108 mg/dL — ABNORMAL HIGH (ref 0–99)
Triglycerides: 117 mg/dL (ref 0–149)
VLDL Cholesterol Cal: 21 mg/dL (ref 5–40)

## 2024-02-17 LAB — VITAMIN D 25 HYDROXY (VIT D DEFICIENCY, FRACTURES): Vit D, 25-Hydroxy: 26.3 ng/mL — ABNORMAL LOW (ref 30.0–100.0)

## 2024-02-17 LAB — TSH: TSH: 1.95 u[IU]/mL (ref 0.450–4.500)

## 2024-02-17 NOTE — Progress Notes (Signed)
 White blood count continues to be elevated but even more elevated today than some which makes me concerned for infection. Are you feeling bad at all?   Fasting glucose elevated. I would like nurse to add A1C to these labs again.   Albumin is low. Make sure you are eating protein 80g a day.   Not much change is cholesterol.  Good cholesterol looks GREAT.  Bad cholesterol not to goal.   Vitamin D  low. Add 2,000 units to vitamin D  you are taking now.   Thyroid looks great.

## 2024-02-23 NOTE — Progress Notes (Signed)
 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 You do need to follow up with GYN for a biopsy. Do you plan on staying with Dr. Nolon Baxter?

## 2024-02-23 NOTE — Progress Notes (Signed)
Normal pelvic ultrasound.

## 2024-02-24 LAB — CYTOLOGY - PAP
Chlamydia: NEGATIVE
Comment: NEGATIVE
Comment: NEGATIVE
Comment: NEGATIVE
Comment: NEGATIVE
Comment: NEGATIVE
Comment: NORMAL
HPV 16: NEGATIVE
HPV 18 / 45: NEGATIVE
High risk HPV: POSITIVE — AB
Neisseria Gonorrhea: NEGATIVE
Trichomonas: NEGATIVE

## 2024-04-05 ENCOUNTER — Encounter: Payer: Self-pay | Admitting: Physician Assistant

## 2024-04-06 ENCOUNTER — Other Ambulatory Visit (HOSPITAL_COMMUNITY): Payer: Self-pay

## 2024-04-06 ENCOUNTER — Telehealth: Payer: Self-pay

## 2024-04-06 NOTE — Telephone Encounter (Signed)
 Pharmacy Patient Advocate Encounter   Received notification from Patient Advice Request messages that prior authorization for Nurtec 75mg  tabs is required/requested.   Insurance verification completed.   The patient is insured through [facility] .   Per test claim: PA required; PA submitted to above mentioned insurance via CoverMyMeds Key/confirmation #/EOC [employer] Status is pending

## 2024-04-06 NOTE — Telephone Encounter (Signed)
 Pharmacy Patient Advocate Encounter  Received notification from [employer] that Prior Authorization for Nurtec 75mg  tabs has been APPROVED from 04/06/24 to 04/06/25. Ran test claim, Copay is $0. This test claim was processed through [facility]- copay amounts may vary at other pharmacies due to pharmacy/plan contracts, or as the patient moves through the different stages of their insurance plan.   PA #/Case ID/Reference #: [id number]  Left a message at [facility] to notify of the approval.

## 2024-04-16 NOTE — Telephone Encounter (Signed)
 This request has been handled. No further action is required. Please review other telephone encounters for additional information.

## 2024-04-22 ENCOUNTER — Encounter: Payer: Self-pay | Admitting: Physician Assistant

## 2024-07-23 ENCOUNTER — Encounter: Payer: Self-pay | Admitting: Physician Assistant

## 2024-07-23 ENCOUNTER — Ambulatory Visit: Admitting: Physician Assistant

## 2024-07-23 VITALS — BP 132/90 | HR 111 | Temp 99.1°F | Ht 67.0 in | Wt 263.0 lb

## 2024-07-23 DIAGNOSIS — J0100 Acute maxillary sinusitis, unspecified: Secondary | ICD-10-CM

## 2024-07-23 DIAGNOSIS — R6889 Other general symptoms and signs: Secondary | ICD-10-CM | POA: Diagnosis not present

## 2024-07-23 DIAGNOSIS — J069 Acute upper respiratory infection, unspecified: Secondary | ICD-10-CM | POA: Diagnosis not present

## 2024-07-23 LAB — POCT RAPID STREP A (OFFICE): Rapid Strep A Screen: NEGATIVE

## 2024-07-23 LAB — POC SOFIA 2 FLU + SARS ANTIGEN FIA
Influenza A, POC: NEGATIVE
Influenza B, POC: NEGATIVE
SARS Coronavirus 2 Ag: NEGATIVE

## 2024-07-23 MED ORDER — AMOXICILLIN-POT CLAVULANATE 875-125 MG PO TABS: 1.0000 | ORAL_TABLET | Freq: Two times a day (BID) | ORAL | 0 refills | Status: AC

## 2024-07-23 MED ORDER — IBUPROFEN 800 MG PO TABS: 800.0000 mg | ORAL_TABLET | Freq: Three times a day (TID) | ORAL | 0 refills | Status: AC | PRN

## 2024-07-23 NOTE — Patient Instructions (Signed)

## 2024-07-23 NOTE — Progress Notes (Signed)
 Acute Office Visit  Subjective:     Patient ID: Shelby Cook, female    DOB: Jul 20, 1986, 38 y.o.   MRN: [id number]  Chief Complaint  Patient presents with   Cough    HPI .SABRADiscussed the use of AI scribe software for clinical note transcription with the patient, who gave verbal consent to proceed.  History of Present Illness A 38 year old female presents with symptoms of an upper respiratory infection.  Pharyngitis - Sore throat present since Wednesday - Pain described as sharp and stabby - Sensation of swelling after taking cephalexin   Sinus congestion - Sinus congestion present since Wednesday - Clear nasal discharge that becomes colorful and dried overnight - Relief with decongestant and Flonase - No epistaxis with Flonase use  Fever - Low-grade fever of 3F today - No fever prior to today  Cough - No cough  Symptom management - Using over-the-counter ibuprofen  for symptom relief  Impact on daily activities - Absent from work on Thursday and Friday due to illness - Requires work note for missed days   ROS See HPI.     Objective:    BP (!) 132/90   Pulse (!) 111   Temp 99.1 F (37.3 C) (Oral)   Ht 5' 7 (1.702 m)   Wt 263 lb (119.3 kg)   SpO2 97%   BMI 41.19 kg/m  BP Readings from Last 3 Encounters:  07/23/24 (!) 132/90  02/16/24 123/73  04/07/23 (!) 118/59   Wt Readings from Last 3 Encounters:  07/23/24 263 lb (119.3 kg)  02/16/24 263 lb (119.3 kg)  04/07/23 253 lb (114.8 kg)      Physical Exam Constitutional:      Appearance: Normal appearance. She is obese.  HENT:     Head: Normocephalic.     Comments: Tenderness to palpation over maxillary sinuses.     Right Ear: Tympanic membrane, ear canal and external ear normal. There is no impacted cerumen.     Left Ear: Tympanic membrane, ear canal and external ear normal. There is no impacted cerumen.     Nose: Congestion present.     Mouth/Throat:     Mouth: Mucous membranes are  moist.     Pharynx: Posterior oropharyngeal erythema present. No oropharyngeal exudate.  Eyes:     Conjunctiva/sclera: Conjunctivae normal.  Cardiovascular:     Rate and Rhythm: Normal rate and regular rhythm.  Pulmonary:     Effort: Pulmonary effort is normal.     Breath sounds: Normal breath sounds.  Musculoskeletal:     Cervical back: Normal range of motion and neck supple. Tenderness present.  Lymphadenopathy:     Cervical: Cervical adenopathy present.  Neurological:     General: No focal deficit present.     Mental Status: She is alert and oriented to person, place, and time.  Psychiatric:        Mood and Affect: Mood normal.     Results for orders placed or performed in visit on 07/23/24  POC SOFIA 2 FLU + SARS ANTIGEN FIA  Result Value Ref Range   Influenza A, POC Negative Negative   Influenza B, POC Negative Negative   SARS Coronavirus 2 Ag Negative Negative  POCT rapid strep A  Result Value Ref Range   Rapid Strep A Screen Negative Negative        Assessment & Plan:  SABRASABRACheyane was seen today for cough.  Diagnoses and all orders for this visit:  Acute URI -     ibuprofen  (  ADVIL ) 800 MG tablet; Take 1 tablet (800 mg total) by mouth every 8 (eight) hours as needed.  Flu-like symptoms -     POC SOFIA 2 FLU + SARS ANTIGEN FIA -     POCT rapid strep A -     ibuprofen  (ADVIL ) 800 MG tablet; Take 1 tablet (800 mg total) by mouth every 8 (eight) hours as needed.  Acute non-recurrent maxillary sinusitis -     amoxicillin -clavulanate (AUGMENTIN ) 875-125 MG tablet; Take 1 tablet by mouth 2 (two) times daily.    Assessment & Plan Acute upper respiratory infection with sore throat, nasal congestion, and fever Likely viral etiology due to negative tests and exposure to sick children. Differential includes secondary bacterial infection if symptoms worsen or persist. Negative for flu/covid/strep - Continue decongestant and Mucinex. - Use Flonase nasal spray. -  Perform saline nasal washes. - Prescribed Augmentin  if symptoms worsen or persist beyond Sunday. - Prescribed ibuprofen  800 mg for sore throat. - Advised salt water gargles and menthol  for throat relief. - Provided work note for Thursday and Friday.    Emelda Kohlbeck, PA-C

## 2024-10-06 ENCOUNTER — Encounter: Payer: Self-pay | Admitting: Physician Assistant

## 2024-10-06 NOTE — Telephone Encounter (Signed)
 Have you seen paperwork? Can you print out the last paperwork I signed so I can use to fill out this one.
